# Patient Record
Sex: Male | Born: 1950 | Race: White | Hispanic: No | Marital: Single | Smoking: Current every day smoker
Health system: Southern US, Community
[De-identification: ages and names within clinical notes are randomized; demographics above are authoritative.]

## PROBLEM LIST (undated history)

## (undated) DIAGNOSIS — M1990 Unspecified osteoarthritis, unspecified site: Secondary | ICD-10-CM

## (undated) DIAGNOSIS — J449 Chronic obstructive pulmonary disease, unspecified: Secondary | ICD-10-CM

## (undated) DIAGNOSIS — F419 Anxiety disorder, unspecified: Secondary | ICD-10-CM

## (undated) DIAGNOSIS — J45909 Unspecified asthma, uncomplicated: Secondary | ICD-10-CM

## (undated) DIAGNOSIS — I10 Essential (primary) hypertension: Secondary | ICD-10-CM

## (undated) DIAGNOSIS — E785 Hyperlipidemia, unspecified: Secondary | ICD-10-CM

## (undated) HISTORY — PX: COLONOSCOPY: SHX174

---

## 2016-01-12 ENCOUNTER — Other Ambulatory Visit (HOSPITAL_COMMUNITY): Payer: Self-pay | Admitting: Nurse Practitioner

## 2016-01-12 DIAGNOSIS — F17200 Nicotine dependence, unspecified, uncomplicated: Secondary | ICD-10-CM

## 2016-01-26 ENCOUNTER — Ambulatory Visit (HOSPITAL_COMMUNITY)
Admission: RE | Admit: 2016-01-26 | Discharge: 2016-01-26 | Disposition: A | Discharge status: Home / Self Care | Payer: Medicare Other | Source: Ambulatory Visit | Attending: Nurse Practitioner | Admitting: Nurse Practitioner

## 2016-01-26 DIAGNOSIS — Z122 Encounter for screening for malignant neoplasm of respiratory organs: Secondary | ICD-10-CM | POA: Diagnosis not present

## 2016-01-26 DIAGNOSIS — F17200 Nicotine dependence, unspecified, uncomplicated: Secondary | ICD-10-CM | POA: Diagnosis present

## 2016-06-24 ENCOUNTER — Emergency Department (HOSPITAL_COMMUNITY)
Admission: EM | Admit: 2016-06-24 | Discharge: 2016-06-24 | Disposition: A | Discharge status: Home / Self Care | Payer: Medicare Other | Attending: Emergency Medicine | Admitting: Emergency Medicine

## 2016-06-24 ENCOUNTER — Encounter (HOSPITAL_COMMUNITY): Payer: Self-pay | Admitting: Emergency Medicine

## 2016-06-24 ENCOUNTER — Emergency Department (HOSPITAL_COMMUNITY): Payer: Medicare Other

## 2016-06-24 DIAGNOSIS — I10 Essential (primary) hypertension: Secondary | ICD-10-CM | POA: Insufficient documentation

## 2016-06-24 DIAGNOSIS — F17210 Nicotine dependence, cigarettes, uncomplicated: Secondary | ICD-10-CM | POA: Diagnosis not present

## 2016-06-24 DIAGNOSIS — J40 Bronchitis, not specified as acute or chronic: Secondary | ICD-10-CM | POA: Insufficient documentation

## 2016-06-24 DIAGNOSIS — R05 Cough: Secondary | ICD-10-CM | POA: Diagnosis present

## 2016-06-24 HISTORY — DX: Essential (primary) hypertension: I10

## 2016-06-24 MED ORDER — PREDNISONE 20 MG PO TABS: 40.0000 mg | ORAL_TABLET | Freq: Every day | ORAL | 0 refills | Status: DC

## 2016-06-24 MED ORDER — ALBUTEROL SULFATE HFA 108 (90 BASE) MCG/ACT IN AERS
2.0000 | INHALATION_SPRAY | Freq: Once | RESPIRATORY_TRACT | Status: AC
  Administered 2016-06-24: 2 via RESPIRATORY_TRACT
  Filled 2016-06-24: qty 6.7

## 2016-06-24 NOTE — ED Provider Notes (Signed)
[facility] DEPT Provider Note   CSN: [id number] Arrival date & time: 06/24/16  1534     History   Chief Complaint Chief Complaint  Patient presents with  . Cough    HPI Rodney Stone is a 66 y.o. male.  HPI   Rodney Stone is a 66 y.o. male who presents to the Emergency Department complaining of cough, nasal congestion and runny nose.  Symptoms present for 2 days.  Tried taking OTC Benadryl with some relief of the nasal congestion, but continues to have a productive cough and generalized malaise.  Nothing makes it better or worse.  He denies fever, body aches, chest pain or shortness of breath.   Past Medical History:  Diagnosis Date  . Hypertension     There are no active problems to display for this patient.   History reviewed. No pertinent surgical history.     Home Medications    Prior to Admission medications   Not on File    Family History History reviewed. No pertinent family history.  Social History Social History  Substance Use Topics  . Smoking status: Current Every Day Smoker    Packs/day: 0.50    Types: Cigarettes  . Smokeless tobacco: Not on file  . Alcohol use No     Allergies   Patient has no known allergies.   Review of Systems Review of Systems  Constitutional: Negative for activity change, appetite change, chills and fever.  HENT: Positive for congestion and rhinorrhea. Negative for facial swelling, sore throat and trouble swallowing.   Respiratory: Positive for cough. Negative for chest tightness, shortness of breath, wheezing and stridor.   Cardiovascular: Negative for chest pain.  Gastrointestinal: Negative for abdominal pain, nausea and vomiting.  Musculoskeletal: Negative for neck pain and neck stiffness.  Skin: Negative.  Negative for rash.  Neurological: Negative for dizziness, weakness, numbness and headaches.  Hematological: Negative for adenopathy.  All other systems reviewed and are negative.    Physical  Exam Updated Vital Signs BP 137/77   Pulse 74   Temp 98.7 F (37.1 C) (Oral)   Resp 18   Ht 6' (1.829 m)   Wt 58.1 kg   SpO2 98%   BMI 17.36 kg/m   Physical Exam  Constitutional: He is oriented to person, place, and time. He appears well-developed and well-nourished. No distress.  HENT:  Head: Normocephalic and atraumatic.  Right Ear: Tympanic membrane and ear canal normal.  Left Ear: Tympanic membrane and ear canal normal.  Nose: Mucosal edema and rhinorrhea present.  Mouth/Throat: Uvula is midline, oropharynx is clear and moist and mucous membranes are normal. No oropharyngeal exudate.  Eyes: EOM are normal. Pupils are equal, round, and reactive to light.  Neck: Normal range of motion, full passive range of motion without pain and phonation normal. Neck supple.  Cardiovascular: Normal rate, regular rhythm, normal heart sounds and intact distal pulses.   No murmur heard. Pulmonary/Chest: Effort normal. No stridor. No respiratory distress. He has no rales. He exhibits no tenderness.  Coarse lungs sounds bilaterally. No rales or wheezing  Musculoskeletal: Normal range of motion. He exhibits no edema.  Lymphadenopathy:    He has no cervical adenopathy.  Neurological: He is alert and oriented to person, place, and time. He exhibits normal muscle tone. Coordination normal.  Skin: Skin is warm. No rash noted.  Nursing note and vitals reviewed.    ED Treatments / Results  Labs (all labs ordered are listed, but only abnormal results are displayed) Labs  Reviewed - No data to display  EKG  EKG Interpretation None       Radiology Dg Chest 2 View  Result Date: 06/24/2016 CLINICAL DATA:  Allergies starting Monday, progression in congestion and productive cough today EXAM: CHEST  2 VIEW COMPARISON:  CT chest 01/26/2016 FINDINGS: Cardiomediastinal silhouette is unremarkable. Mild hyperinflation. No infiltrate or pulmonary edema. Stable bilateral chronic interstitial prominence  and mild emphysematous changes IMPRESSION: No active cardiopulmonary disease. Stable hyperinflation and chronic emphysematous changes. Electronically Signed   By: Lahoma Crocker M.D.   On: 06/24/2016 16:16    Procedures Procedures (including critical care time)  Medications Ordered in ED Medications - No data to display   Initial Impression / Assessment and Plan / ED Course  I have reviewed the triage vital signs and the nursing notes.  Pertinent labs & imaging results that were available during my care of the patient were reviewed by me and considered in my medical decision making (see chart for details).     Pt well appearing.  Vitals stable.  Doubt PE.  CXR neg for PNA.  Pt continues to smoke.  Likely bronchitis.  Albuterol MDI dispensed.  rx written for prednisone.  Return precautions given.    Final Clinical Impressions(s) / ED Diagnoses   Final diagnoses:  Bronchitis    New Prescriptions New Prescriptions   No medications on file     Kem Parkinson, Hershal Coria 06/24/16 1701    Milton Ferguson, MD 06/26/16 [phone]

## 2016-06-24 NOTE — ED Triage Notes (Signed)
Pt states having allergies on Monday, today has been more congested with productive cough.  Pt states "im just not feeling up to myself".  Pt denies pain at this time.

## 2016-06-24 NOTE — Discharge Instructions (Signed)
2 puffs of the inhaler 4 times a day as needed.  Follow-up with your doctor or return here for any worsening symptoms

## 2017-01-14 ENCOUNTER — Other Ambulatory Visit (HOSPITAL_COMMUNITY): Payer: Self-pay | Admitting: Nurse Practitioner

## 2017-01-14 DIAGNOSIS — Z720 Tobacco use: Secondary | ICD-10-CM

## 2017-01-14 DIAGNOSIS — Z122 Encounter for screening for malignant neoplasm of respiratory organs: Secondary | ICD-10-CM

## 2017-02-03 ENCOUNTER — Other Ambulatory Visit (HOSPITAL_COMMUNITY): Payer: Self-pay | Admitting: Nurse Practitioner

## 2017-02-03 DIAGNOSIS — Z720 Tobacco use: Secondary | ICD-10-CM

## 2017-02-03 DIAGNOSIS — Z87891 Personal history of nicotine dependence: Secondary | ICD-10-CM

## 2017-02-03 DIAGNOSIS — Z122 Encounter for screening for malignant neoplasm of respiratory organs: Secondary | ICD-10-CM

## 2017-02-07 ENCOUNTER — Encounter (HOSPITAL_COMMUNITY): Payer: Self-pay

## 2017-02-07 ENCOUNTER — Ambulatory Visit (HOSPITAL_COMMUNITY): Payer: Medicare HMO

## 2017-08-17 ENCOUNTER — Other Ambulatory Visit (HOSPITAL_COMMUNITY): Payer: Self-pay | Admitting: Nurse Practitioner

## 2017-08-17 DIAGNOSIS — Z720 Tobacco use: Secondary | ICD-10-CM

## 2017-08-23 ENCOUNTER — Other Ambulatory Visit (HOSPITAL_COMMUNITY): Payer: Self-pay | Admitting: Nurse Practitioner

## 2017-08-23 DIAGNOSIS — Z87891 Personal history of nicotine dependence: Secondary | ICD-10-CM

## 2017-08-23 DIAGNOSIS — Z122 Encounter for screening for malignant neoplasm of respiratory organs: Secondary | ICD-10-CM

## 2017-09-14 ENCOUNTER — Ambulatory Visit (HOSPITAL_COMMUNITY)
Admission: RE | Admit: 2017-09-14 | Discharge: 2017-09-14 | Disposition: A | Discharge status: Home / Self Care | Payer: Medicare HMO | Source: Ambulatory Visit | Attending: Nurse Practitioner | Admitting: Nurse Practitioner

## 2017-09-14 DIAGNOSIS — Z122 Encounter for screening for malignant neoplasm of respiratory organs: Secondary | ICD-10-CM | POA: Insufficient documentation

## 2017-09-14 DIAGNOSIS — Z87891 Personal history of nicotine dependence: Secondary | ICD-10-CM | POA: Diagnosis present

## 2017-09-14 DIAGNOSIS — J432 Centrilobular emphysema: Secondary | ICD-10-CM | POA: Insufficient documentation

## 2017-09-14 DIAGNOSIS — I700 Atherosclerosis of aorta: Secondary | ICD-10-CM | POA: Insufficient documentation

## 2018-06-10 ENCOUNTER — Emergency Department (HOSPITAL_COMMUNITY)
Admission: EM | Admit: 2018-06-10 | Discharge: 2018-06-10 | Disposition: A | Discharge status: Home / Self Care | Payer: Medicare HMO | Attending: Emergency Medicine | Admitting: Emergency Medicine

## 2018-06-10 ENCOUNTER — Encounter (HOSPITAL_COMMUNITY): Payer: Self-pay

## 2018-06-10 DIAGNOSIS — F17210 Nicotine dependence, cigarettes, uncomplicated: Secondary | ICD-10-CM | POA: Insufficient documentation

## 2018-06-10 DIAGNOSIS — J101 Influenza due to other identified influenza virus with other respiratory manifestations: Secondary | ICD-10-CM | POA: Insufficient documentation

## 2018-06-10 DIAGNOSIS — I10 Essential (primary) hypertension: Secondary | ICD-10-CM | POA: Diagnosis not present

## 2018-06-10 DIAGNOSIS — R6883 Chills (without fever): Secondary | ICD-10-CM | POA: Diagnosis present

## 2018-06-10 HISTORY — DX: Unspecified osteoarthritis, unspecified site: M19.90

## 2018-06-10 HISTORY — DX: Hyperlipidemia, unspecified: E78.5

## 2018-06-10 HISTORY — DX: Anxiety disorder, unspecified: F41.9

## 2018-06-10 LAB — INFLUENZA PANEL BY PCR (TYPE A & B)
Influenza A By PCR: POSITIVE — AB
Influenza B By PCR: NEGATIVE

## 2018-06-10 NOTE — Discharge Instructions (Addendum)
Evaluated today for influenza.  Your test was positive for flu A.  Please make sure to rest, rehydrate.  You may also use Tylenol and ibuprofen as needed for body aches and pains.  Follow up with PCP for reevaluation in the next 2 days.  Return to ED for any worsening symptoms.

## 2018-06-10 NOTE — ED Provider Notes (Signed)
[facility] EMERGENCY DEPARTMENT Provider Note   CSN: [id number] Arrival date & time: 06/10/18  1348  History   Chief Complaint Chief Complaint  Patient presents with  . flu like symptoms    HPI Rodney Stone is a 68 y.o. male with medical history significant for hypertension, hyperlipidemia who presents for evaluation of flulike symptoms.  Patient states symptoms started suddenly 4 days ago.  Patient states he has had chills, body aches and pains and no energy.  Patient's been tolerating p.o. intake without difficulty.  Patient states on Sunday he was "hanging out with my friend who was sick."  Unsure if this patient had the flu or not.  Has felt warm, however is not taken his temperature.  Has had congestion and rhinorrhea.  Rhinorrhea clear in color.  Did not get flu vaccine.  Denies headache, sore throat, cough, shortness of breath, chest pain, neck pain, neck stiffness, nausea, emesis, abdominal pain, diarrhea, dysuria, constipation.  Patient states "I just want to know if I have the flu and nothing else."  Patient states his symptoms have been improving since symptom onset.  He was able to go grocery shopping today as well as do "normal errands."  History provided by patient.  No interpreter was used.  HPI  Past Medical History:  Diagnosis Date  . Anxiety   . Arthritis   . Hyperlipidemia   . Hypertension     There are no active problems to display for this patient.   History reviewed. No pertinent surgical history.      Home Medications    Prior to Admission medications   Medication Sig Start Date End Date Taking? Authorizing Provider  predniSONE (DELTASONE) 20 MG tablet Take 2 tablets (40 mg total) by mouth daily. For 5 days 06/24/16   Kem Parkinson, PA-C    Family History No family history on file.  Social History Social History   Tobacco Use  . Smoking status: Current Every Day Smoker    Packs/day: 0.50    Types: Cigarettes  Substance Use Topics  .  Alcohol use: No  . Drug use: No     Allergies   Patient has no known allergies.   Review of Systems Review of Systems  Constitutional: Positive for activity change, chills and fever.  HENT: Positive for congestion, postnasal drip and rhinorrhea. Negative for sinus pressure, sinus pain, sneezing, sore throat, tinnitus, trouble swallowing and voice change.   Eyes: Negative.   Respiratory: Negative.   Cardiovascular: Negative.   Gastrointestinal: Negative.   Genitourinary: Negative.   Musculoskeletal: Negative.   Skin: Negative.   Neurological: Negative.   All other systems reviewed and are negative.    Physical Exam Updated Vital Signs BP 117/98 (BP Location: Right Arm)   Pulse (!) 105   Temp 98.7 F (37.1 C) (Oral)   Resp 18   Ht 6' (1.829 m)   Wt 59.9 kg   SpO2 97%   BMI 17.90 kg/m   Physical Exam Vitals signs and nursing note reviewed.  Constitutional:      General: He is not in acute distress.    Appearance: He is well-developed. He is not ill-appearing, toxic-appearing or diaphoretic.  HENT:     Head: Normocephalic and atraumatic.     Jaw: There is normal jaw occlusion.     Right Ear: Tympanic membrane, ear canal and external ear normal. No drainage, swelling or tenderness. Tympanic membrane is not perforated, erythematous, retracted or bulging.     Left Ear:  Tympanic membrane, ear canal and external ear normal. No drainage, swelling or tenderness. Tympanic membrane is not perforated, erythematous, retracted or bulging.     Nose: Rhinorrhea present. No nasal tenderness or mucosal edema.     Right Sinus: No maxillary sinus tenderness or frontal sinus tenderness.     Left Sinus: No maxillary sinus tenderness or frontal sinus tenderness.     Comments: Clear rhinorrhea to bilateral nares.    Mouth/Throat:     Lips: Pink.     Mouth: Mucous membranes are moist.     Pharynx: Oropharynx is clear. Uvula midline.     Tonsils: No tonsillar exudate or tonsillar  abscesses.     Comments: Posterior oropharynx clear.  Uvula midline without deviation.  No tonsillar edema or exudate.  Mucous membranes moist. Eyes:     Pupils: Pupils are equal, round, and reactive to light.  Neck:     Musculoskeletal: Full passive range of motion without pain, normal range of motion and neck supple.     Comments: No neck stiffness or neck rigidity.  Phonation normal.  No drooling, trismus or dysphasia. Cardiovascular:     Rate and Rhythm: Regular rhythm. Tachycardia present.     Pulses: Normal pulses.     Heart sounds: Normal heart sounds.     Comments: HR 100 Pulmonary:     Effort: Pulmonary effort is normal. No respiratory distress.     Comments: Clear to auscultation bilaterally without wheeze, rhonchi or rales.  No accessory muscle usage.  Able speak in full sentences without difficulty. Abdominal:     General: There is no distension.     Palpations: Abdomen is soft.     Comments: Soft, nontender without rebound or guarding.  Normoactive bowel sounds.  Musculoskeletal: Normal range of motion.     Comments: Moves all extremities without difficulty.  Ambulatory in department.  Lymphadenopathy:     Comments: No cervical lymphadenopathy.  Skin:    General: Skin is warm and dry.     Comments: No rashes.  Neurological:     Mental Status: He is alert.      ED Treatments / Results  Labs (all labs ordered are listed, but only abnormal results are displayed) Labs Reviewed  INFLUENZA PANEL BY PCR (TYPE A & B) - Abnormal; Notable for the following components:      Result Value   Influenza A By PCR POSITIVE (*)    All other components within normal limits    EKG None  Radiology No results found.  Procedures Procedures (including critical care time)  Medications Ordered in ED Medications - No data to display   Initial Impression / Assessment and Plan / ED Course  I have reviewed the triage vital signs and the nursing notes.  Pertinent labs & imaging  results that were available during my care of the patient were reviewed by me and considered in my medical decision making (see chart for details).  68 year old male who appears otherwise well presents for evaluation of flulike symptoms.  Afebrile, nonseptic, non-ill-appearing.  Symptom onset 4 days ago, Wednesday.  Patient with multiple sick exposures.  Did not receive flu vaccine.  Patient does not want imaging or blood work at this time.  Discussed risk vs benefit.  Patient voiced understanding and has declined additional testing at this time.  Tachycardia at 100 during exam.  Oxygen saturation 97% on room air.  Lungs clear to auscultation bilaterally without wheeze, rhonchi or rales.  No accessory muscle usage.  No hypoxia or tachypnea, low suspicion for pneumonia.  Posterior oropharynx clear, tonsils without edema or exudate.  Mucous membranes moist.  Low suspicion for pharyngitis.  Patient positive for influenza A.  He appears otherwise well on exam.  Ambulatory in department without difficulty. Able to tolerate p.o. intake without difficulty.   Patient with symptoms consistent with influenza.  Vitals are stable.  No signs of dehydration. The patient understands that symptoms are greater than the recommended 24-48 hour window of treatment.  Patient will be discharged with instructions to orally hydrate, rest, and use over-the-counter medications such as anti-inflammatories ibuprofen and Aleve for muscle aches and Tylenol for fever. Patient  hemodynamically stable and appropriate for DC home at this time. Nursing is notified me that patient's blood pressure at DC 97/68.  Patient refusing recheck at this time.  Discussed with him follow-up with PCP for reevaluation.  He has no dizziness or lightheadedness.  Discussed strict return precautions.  Patient voiced understanding and is agreeable for follow-up.    Final Clinical Impressions(s) / ED Diagnoses   Final diagnoses:  Influenza A    ED Discharge  Orders    None       Henderly, Britni A, PA-C 06/10/18 1852    Julianne Rice, MD 06/11/18 1536

## 2018-06-10 NOTE — ED Triage Notes (Addendum)
Pt reports that he body aches, fever, chills and no energy for several days

## 2019-03-23 ENCOUNTER — Other Ambulatory Visit: Payer: Self-pay | Admitting: Nurse Practitioner

## 2019-03-23 DIAGNOSIS — Z720 Tobacco use: Secondary | ICD-10-CM

## 2019-04-11 ENCOUNTER — Ambulatory Visit (HOSPITAL_COMMUNITY): Payer: Medicare HMO

## 2019-04-11 ENCOUNTER — Encounter (HOSPITAL_COMMUNITY): Payer: Self-pay

## 2019-05-25 ENCOUNTER — Other Ambulatory Visit: Payer: Self-pay

## 2019-05-25 ENCOUNTER — Ambulatory Visit (HOSPITAL_COMMUNITY)
Admission: RE | Admit: 2019-05-25 | Discharge: 2019-05-25 | Disposition: A | Discharge status: Home / Self Care | Payer: Medicare HMO | Source: Ambulatory Visit | Attending: Nurse Practitioner | Admitting: Nurse Practitioner

## 2019-05-25 DIAGNOSIS — Z720 Tobacco use: Secondary | ICD-10-CM | POA: Insufficient documentation

## 2019-10-04 ENCOUNTER — Encounter: Payer: Self-pay | Admitting: Internal Medicine

## 2019-12-25 ENCOUNTER — Ambulatory Visit: Payer: Medicare HMO | Admitting: Nurse Practitioner

## 2019-12-25 ENCOUNTER — Encounter: Payer: Self-pay | Admitting: Nurse Practitioner

## 2019-12-25 NOTE — Progress Notes (Deleted)
Primary Care Physician:  Renee Rival, NP Primary Gastroenterologist:  Dr.   Rayne Du chief complaint on file.   HPI:   Rodney Stone is a 69 y.o. male who presents on referral from primary care to schedule colonoscopy.  Nurse/phone triage was deferred office visit due to medications likely necessitating MAC sedation.  No history of colonoscopy in our system.  Today he states he is doing okay overall   Past Medical History:  Diagnosis Date  . Anxiety   . Arthritis   . Hyperlipidemia   . Hypertension     No past surgical history on file.  Current Outpatient Medications  Medication Sig Dispense Refill  . predniSONE (DELTASONE) 20 MG tablet Take 2 tablets (40 mg total) by mouth daily. For 5 days 10 tablet 0   No current facility-administered medications for this visit.    Allergies as of 12/25/2019  . (No Known Allergies)    No family history on file.  Social History   Socioeconomic History  . Marital status: Widowed    Spouse name: Not on file  . Number of children: Not on file  . Years of education: Not on file  . Highest education level: Not on file  Occupational History  . Not on file  Tobacco Use  . Smoking status: Current Every Day Smoker    Packs/day: 0.50    Types: Cigarettes  Substance and Sexual Activity  . Alcohol use: No  . Drug use: No  . Sexual activity: Not on file  Other Topics Concern  . Not on file  Social History Narrative  . Not on file   Social Determinants of Health   Financial Resource Strain:   . Difficulty of Paying Living Expenses: Not on file  Food Insecurity:   . Worried About Charity fundraiser in the Last Year: Not on file  . Ran Out of Food in the Last Year: Not on file  Transportation Needs:   . Lack of Transportation (Medical): Not on file  . Lack of Transportation (Non-Medical): Not on file  Physical Activity:   . Days of Exercise per Week: Not on file  . Minutes of Exercise per Session: Not on file    Stress:   . Feeling of Stress : Not on file  Social Connections:   . Frequency of Communication with Friends and Family: Not on file  . Frequency of Social Gatherings with Friends and Family: Not on file  . Attends Religious Services: Not on file  . Active Member of Clubs or Organizations: Not on file  . Attends Archivist Meetings: Not on file  . Marital Status: Not on file  Intimate Partner Violence:   . Fear of Current or Ex-Partner: Not on file  . Emotionally Abused: Not on file  . Physically Abused: Not on file  . Sexually Abused: Not on file    Subjective: Review of Systems  Constitutional: Negative for chills, fever, malaise/fatigue and weight loss.  HENT: Negative for congestion and sore throat.   Respiratory: Negative for cough and shortness of breath.   Cardiovascular: Negative for chest pain and palpitations.  Gastrointestinal: Negative for abdominal pain, blood in stool, diarrhea, melena, nausea and vomiting.  Musculoskeletal: Negative for joint pain and myalgias.  Skin: Negative for rash.  Neurological: Negative for dizziness and weakness.  Endo/Heme/Allergies: Does not bruise/bleed easily.  Psychiatric/Behavioral: Negative for depression. The patient is not nervous/anxious.   All other systems reviewed and are negative.  Objective: There were no vitals taken for this visit. Physical Exam Vitals and nursing note reviewed.  Constitutional:      General: He is not in acute distress.    Appearance: Normal appearance. He is not ill-appearing, toxic-appearing or diaphoretic.  HENT:     Head: Normocephalic and atraumatic.     Nose: No congestion or rhinorrhea.  Eyes:     General: No scleral icterus. Cardiovascular:     Rate and Rhythm: Normal rate and regular rhythm.     Heart sounds: Normal heart sounds.  Pulmonary:     Effort: Pulmonary effort is normal.     Breath sounds: Normal breath sounds.  Abdominal:     General: Bowel sounds are  normal. There is no distension.     Palpations: Abdomen is soft. There is no hepatomegaly, splenomegaly or mass.     Tenderness: There is no abdominal tenderness. There is no guarding or rebound.     Hernia: No hernia is present.  Musculoskeletal:     Cervical back: Neck supple.  Skin:    General: Skin is warm and dry.     Coloration: Skin is not jaundiced.     Findings: No bruising or rash.  Neurological:     General: No focal deficit present.     Mental Status: He is alert and oriented to person, place, and time. Mental status is at baseline.  Psychiatric:        Mood and Affect: Mood normal.        Behavior: Behavior normal.        Thought Content: Thought content normal.      Assessment:  ***   Plan: ***      12/25/2019 7:50 AM   Disclaimer: This note was dictated with voice recognition software. Similar sounding words can inadvertently be transcribed and may not be corrected upon review.

## 2020-01-01 ENCOUNTER — Encounter: Payer: Self-pay | Admitting: Internal Medicine

## 2020-02-19 ENCOUNTER — Ambulatory Visit: Payer: Medicare HMO | Admitting: Nurse Practitioner

## 2020-02-20 ENCOUNTER — Ambulatory Visit: Payer: Medicare HMO | Admitting: Internal Medicine

## 2020-02-20 ENCOUNTER — Encounter: Payer: Self-pay | Admitting: Internal Medicine

## 2020-02-20 ENCOUNTER — Telehealth: Payer: Self-pay | Admitting: *Deleted

## 2020-02-20 ENCOUNTER — Other Ambulatory Visit: Payer: Self-pay

## 2020-02-20 DIAGNOSIS — Z86010 Personal history of colonic polyps: Secondary | ICD-10-CM

## 2020-02-20 DIAGNOSIS — K219 Gastro-esophageal reflux disease without esophagitis: Secondary | ICD-10-CM | POA: Diagnosis not present

## 2020-02-20 DIAGNOSIS — Z800 Family history of malignant neoplasm of digestive organs: Secondary | ICD-10-CM | POA: Diagnosis not present

## 2020-02-20 MED ORDER — NA SULFATE-K SULFATE-MG SULF 17.5-3.13-1.6 GM/177ML PO SOLN: 1.0000 | Freq: Once | ORAL | 0 refills | Status: AC

## 2020-02-20 NOTE — Patient Instructions (Addendum)
We will schedule you for colonoscopy today in clinic given history of colon polyps as well as family history of colorectal cancer.  I would recommend that we perform colonoscopy at least every 5 years going forward.  Further recommendations to follow.  Lifestyle and home remedies TO MANAGE REFLUX/HEARTBURN    You may eliminate or reduce the frequency of heartburn by making the following lifestyle changes:    Control your weight. Being overweight is a major risk factor for heartburn and GERD. Excess pounds put pressure on your abdomen, pushing up your stomach and causing acid to back up into your esophagus.     Eat smaller meals. 4 TO 6 MEALS A DAY. This reduces pressure on the lower esophageal sphincter, helping to prevent the valve from opening and acid from washing back into your esophagus.      Loosen your belt. Clothes that fit tightly around your waist put pressure on your abdomen and the lower esophageal sphincter.      Eliminate heartburn triggers. Everyone has specific triggers. Common triggers such as fatty or fried foods, spicy food, tomato sauce, carbonated beverages, alcohol, chocolate, mint, garlic, onion, caffeine and nicotine may make heartburn worse.     Avoid stooping or bending. Tying your shoes is OK. Bending over for longer periods to weed your garden isn't, especially soon after eating.     Don't lie down after a meal. Wait at least three to four hours after eating before going to bed, and don't lie down right after eating.    At [facility] we value your feedback. You may receive a survey about your visit today. Please share your experience as we strive to create trusting relationships with our patients to provide genuine, compassionate, quality care.  We appreciate your understanding and patience as we review any laboratory studies, imaging, and other diagnostic tests that are ordered as we care for you. Our office policy is 5 business days for  review of these results, and any emergent or urgent results are addressed in a timely manner for your best interest. If you do not hear from our office in 1 week, please contact us.   We also encourage the use of MyChart, which contains your medical information for your review as well. If you are not enrolled in this feature, an access code is on this after visit summary for your convenience. Thank you for allowing [place] to be involved in your care.  It was great to see you today!  I hope you have a great rest of your fall!!   Neveah Bang K. Abbey Chatters, D.O. Gastroenterology and Hepatology [facility]

## 2020-02-20 NOTE — H&P (View-Only) (Signed)
Primary Care Physician:  Renee Rival, NP Primary Gastroenterologist:  Dr. Abbey Chatters  Chief Complaint  Patient presents with  . Consult    TCS done age 69 in [place], [place]. Mother passed away age 33 with colon cancer    HPI:   Rodney Stone is a 69 y.o. male who presents to clinic today by referral from his PCP Angelina Ok to discuss colonoscopy.  States his last colonoscopy was 7 years ago performed in [place].  Notes multiple polyps removed though I do not have access to this report.  Also notes that his mother died from colon cancer at the age of 64.  Patient denies any melena hematochezia.  No unintentional weight loss.  No abdominal pain.  Does have intermittent reflux which appears to be related to what he eats.  Does not take any chronic medications for reflux but will take as needed Tums which relieves his symptoms.  No overt dysphagia or odynophagia.  No history of PUD or H. pylori that he knows of.  Otherwise he has no other complaints.  Past Medical History:  Diagnosis Date  . Anxiety   . Arthritis   . Hyperlipidemia   . Hypertension     Past Surgical History:  Procedure Laterality Date  . COLONOSCOPY      Current Outpatient Medications  Medication Sig Dispense Refill  . albuterol (VENTOLIN HFA) 108 (90 Base) MCG/ACT inhaler Inhale into the lungs as needed.    Marland Kitchen buPROPion (WELLBUTRIN XL) 300 MG 24 hr tablet Take 300 mg by mouth every morning.    . Cholecalciferol (VITAMIN D3 PO) Take by mouth daily.    . clonazePAM (KLONOPIN) 0.5 MG tablet Take 0.5 mg by mouth 3 (three) times daily as needed.    Marland Kitchen lisinopril (ZESTRIL) 20 MG tablet Take 20 mg by mouth daily.    . naproxen sodium (ALEVE) 220 MG tablet Take 220 mg by mouth daily as needed.    Marland Kitchen oxyCODONE-acetaminophen (PERCOCET) 7.5-325 MG tablet Take 1 tablet by mouth 4 (four) times daily as needed.    . simvastatin (ZOCOR) 20 MG tablet Take 20 mg by mouth at bedtime.     No current  facility-administered medications for this visit.    Allergies as of 02/20/2020  . (No Known Allergies)    Family History  Problem Relation Age of Onset  . Colon cancer Mother   . Stroke Father     Social History   Socioeconomic History  . Marital status: Widowed    Spouse name: Not on file  . Number of children: Not on file  . Years of education: Not on file  . Highest education level: Not on file  Occupational History  . Not on file  Tobacco Use  . Smoking status: Current Every Day Smoker    Packs/day: 0.50    Types: Cigarettes  . Smokeless tobacco: Never Used  Substance and Sexual Activity  . Alcohol use: No  . Drug use: No  . Sexual activity: Not on file  Other Topics Concern  . Not on file  Social History Narrative  . Not on file   Social Determinants of Health   Financial Resource Strain:   . Difficulty of Paying Living Expenses: Not on file  Food Insecurity:   . Worried About Charity fundraiser in the Last Year: Not on file  . Ran Out of Food in the Last Year: Not on file  Transportation Needs:   . Lack of  Transportation (Medical): Not on file  . Lack of Transportation (Non-Medical): Not on file  Physical Activity:   . Days of Exercise per Week: Not on file  . Minutes of Exercise per Session: Not on file  Stress:   . Feeling of Stress : Not on file  Social Connections:   . Frequency of Communication with Friends and Family: Not on file  . Frequency of Social Gatherings with Friends and Family: Not on file  . Attends Religious Services: Not on file  . Active Member of Clubs or Organizations: Not on file  . Attends Archivist Meetings: Not on file  . Marital Status: Not on file  Intimate Partner Violence:   . Fear of Current or Ex-Partner: Not on file  . Emotionally Abused: Not on file  . Physically Abused: Not on file  . Sexually Abused: Not on file    Subjective: Review of Systems  Constitutional: Negative for chills and fever.    HENT: Negative for congestion and hearing loss.   Eyes: Negative for blurred vision and double vision.  Respiratory: Negative for cough and shortness of breath.   Cardiovascular: Negative for chest pain and palpitations.  Gastrointestinal: Negative for abdominal pain, blood in stool, constipation, diarrhea, heartburn, melena and vomiting.  Genitourinary: Negative for dysuria and urgency.  Musculoskeletal: Negative for joint pain and myalgias.  Skin: Negative for itching and rash.  Neurological: Negative for dizziness and headaches.  Psychiatric/Behavioral: Negative for depression. The patient is not nervous/anxious.        Objective: BP 125/82   Pulse (!) 121   Temp (!) 97.1 F (36.2 C)   Ht 6' (1.829 m)   Wt 150 lb 12.8 oz (68.4 kg)   BMI 20.45 kg/m  Physical Exam Constitutional:      Appearance: Normal appearance.  HENT:     Head: Normocephalic and atraumatic.  Eyes:     Extraocular Movements: Extraocular movements intact.     Conjunctiva/sclera: Conjunctivae normal.  Cardiovascular:     Rate and Rhythm: Normal rate and regular rhythm.  Pulmonary:     Effort: Pulmonary effort is normal.     Breath sounds: Normal breath sounds.  Abdominal:     General: Bowel sounds are normal.     Palpations: Abdomen is soft.  Musculoskeletal:        General: Normal range of motion.     Cervical back: Normal range of motion and neck supple.  Skin:    General: Skin is warm.  Neurological:     General: No focal deficit present.     Mental Status: He is alert and oriented to person, place, and time.  Psychiatric:        Mood and Affect: Mood normal.        Behavior: Behavior normal.      Assessment: *History of adenomatous colon polyps *Family history of colorectal cancer in mother *GERD-intermittent, mild  Plan: We will schedule patient for surveillance colonoscopy today in clinic The risks including infection, bleed, or perforation as well as benefits, limitations,  alternatives and imponderables have been reviewed with the patient. Questions have been answered. All parties agreeable.  I have recommended that we perform colonoscopy at least every 5 years given his family history of colon cancer and he understands.  His chronic reflux is intermittent and mild well-controlled on as needed Tums.  If this worsens, we can consider starting him on PPI therapy and/or further evaluation with EGD.  I will print off home  practices to reduce reflux symptoms.  Thank you Angelina Ok for the kind referral.  02/20/2020 9:25 AM   Disclaimer: This note was dictated with voice recognition software. Similar sounding words can inadvertently be transcribed and may not be corrected upon review.

## 2020-02-20 NOTE — Telephone Encounter (Signed)
error 

## 2020-02-20 NOTE — Progress Notes (Signed)
Primary Care Physician:  Renee Rival, NP Primary Gastroenterologist:  Dr. Abbey Chatters  Chief Complaint  Patient presents with  . Consult    TCS done age 69 in [place], [place]. Mother passed away age 29 with colon cancer    HPI:   Rodney Stone is a 69 y.o. male who presents to clinic today by referral from his PCP Angelina Ok to discuss colonoscopy.  States his last colonoscopy was 7 years ago performed in [place].  Notes multiple polyps removed though I do not have access to this report.  Also notes that his mother died from colon cancer at the age of 79.  Patient denies any melena hematochezia.  No unintentional weight loss.  No abdominal pain.  Does have intermittent reflux which appears to be related to what he eats.  Does not take any chronic medications for reflux but will take as needed Tums which relieves his symptoms.  No overt dysphagia or odynophagia.  No history of PUD or H. pylori that he knows of.  Otherwise he has no other complaints.  Past Medical History:  Diagnosis Date  . Anxiety   . Arthritis   . Hyperlipidemia   . Hypertension     Past Surgical History:  Procedure Laterality Date  . COLONOSCOPY      Current Outpatient Medications  Medication Sig Dispense Refill  . albuterol (VENTOLIN HFA) 108 (90 Base) MCG/ACT inhaler Inhale into the lungs as needed.    Marland Kitchen buPROPion (WELLBUTRIN XL) 300 MG 24 hr tablet Take 300 mg by mouth every morning.    . Cholecalciferol (VITAMIN D3 PO) Take by mouth daily.    . clonazePAM (KLONOPIN) 0.5 MG tablet Take 0.5 mg by mouth 3 (three) times daily as needed.    Marland Kitchen lisinopril (ZESTRIL) 20 MG tablet Take 20 mg by mouth daily.    . naproxen sodium (ALEVE) 220 MG tablet Take 220 mg by mouth daily as needed.    Marland Kitchen oxyCODONE-acetaminophen (PERCOCET) 7.5-325 MG tablet Take 1 tablet by mouth 4 (four) times daily as needed.    . simvastatin (ZOCOR) 20 MG tablet Take 20 mg by mouth at bedtime.     No current  facility-administered medications for this visit.    Allergies as of 02/20/2020  . (No Known Allergies)    Family History  Problem Relation Age of Onset  . Colon cancer Mother   . Stroke Father     Social History   Socioeconomic History  . Marital status: Widowed    Spouse name: Not on file  . Number of children: Not on file  . Years of education: Not on file  . Highest education level: Not on file  Occupational History  . Not on file  Tobacco Use  . Smoking status: Current Every Day Smoker    Packs/day: 0.50    Types: Cigarettes  . Smokeless tobacco: Never Used  Substance and Sexual Activity  . Alcohol use: No  . Drug use: No  . Sexual activity: Not on file  Other Topics Concern  . Not on file  Social History Narrative  . Not on file   Social Determinants of Health   Financial Resource Strain:   . Difficulty of Paying Living Expenses: Not on file  Food Insecurity:   . Worried About Charity fundraiser in the Last Year: Not on file  . Ran Out of Food in the Last Year: Not on file  Transportation Needs:   . Lack of  Transportation (Medical): Not on file  . Lack of Transportation (Non-Medical): Not on file  Physical Activity:   . Days of Exercise per Week: Not on file  . Minutes of Exercise per Session: Not on file  Stress:   . Feeling of Stress : Not on file  Social Connections:   . Frequency of Communication with Friends and Family: Not on file  . Frequency of Social Gatherings with Friends and Family: Not on file  . Attends Religious Services: Not on file  . Active Member of Clubs or Organizations: Not on file  . Attends Archivist Meetings: Not on file  . Marital Status: Not on file  Intimate Partner Violence:   . Fear of Current or Ex-Partner: Not on file  . Emotionally Abused: Not on file  . Physically Abused: Not on file  . Sexually Abused: Not on file    Subjective: Review of Systems  Constitutional: Negative for chills and fever.   HENT: Negative for congestion and hearing loss.   Eyes: Negative for blurred vision and double vision.  Respiratory: Negative for cough and shortness of breath.   Cardiovascular: Negative for chest pain and palpitations.  Gastrointestinal: Negative for abdominal pain, blood in stool, constipation, diarrhea, heartburn, melena and vomiting.  Genitourinary: Negative for dysuria and urgency.  Musculoskeletal: Negative for joint pain and myalgias.  Skin: Negative for itching and rash.  Neurological: Negative for dizziness and headaches.  Psychiatric/Behavioral: Negative for depression. The patient is not nervous/anxious.        Objective: BP 125/82   Pulse (!) 121   Temp (!) 97.1 F (36.2 C)   Ht 6' (1.829 m)   Wt 150 lb 12.8 oz (68.4 kg)   BMI 20.45 kg/m  Physical Exam Constitutional:      Appearance: Normal appearance.  HENT:     Head: Normocephalic and atraumatic.  Eyes:     Extraocular Movements: Extraocular movements intact.     Conjunctiva/sclera: Conjunctivae normal.  Cardiovascular:     Rate and Rhythm: Normal rate and regular rhythm.  Pulmonary:     Effort: Pulmonary effort is normal.     Breath sounds: Normal breath sounds.  Abdominal:     General: Bowel sounds are normal.     Palpations: Abdomen is soft.  Musculoskeletal:        General: Normal range of motion.     Cervical back: Normal range of motion and neck supple.  Skin:    General: Skin is warm.  Neurological:     General: No focal deficit present.     Mental Status: He is alert and oriented to person, place, and time.  Psychiatric:        Mood and Affect: Mood normal.        Behavior: Behavior normal.      Assessment: *History of adenomatous colon polyps *Family history of colorectal cancer in mother *GERD-intermittent, mild  Plan: We will schedule patient for surveillance colonoscopy today in clinic The risks including infection, bleed, or perforation as well as benefits, limitations,  alternatives and imponderables have been reviewed with the patient. Questions have been answered. All parties agreeable.  I have recommended that we perform colonoscopy at least every 5 years given his family history of colon cancer and he understands.  His chronic reflux is intermittent and mild well-controlled on as needed Tums.  If this worsens, we can consider starting him on PPI therapy and/or further evaluation with EGD.  I will print off home practices  to reduce reflux symptoms.  Thank you Angelina Ok for the kind referral.  02/20/2020 9:25 AM   Disclaimer: This note was dictated with voice recognition software. Similar sounding words can inadvertently be transcribed and may not be corrected upon review.

## 2020-03-11 ENCOUNTER — Encounter (HOSPITAL_COMMUNITY): Payer: Self-pay | Admitting: *Deleted

## 2020-03-13 ENCOUNTER — Other Ambulatory Visit (HOSPITAL_COMMUNITY)
Admission: RE | Admit: 2020-03-13 | Discharge: 2020-03-13 | Disposition: A | Discharge status: Home / Self Care | Payer: Medicare HMO | Source: Ambulatory Visit | Attending: Internal Medicine | Admitting: Internal Medicine

## 2020-03-13 ENCOUNTER — Telehealth: Payer: Self-pay | Admitting: Internal Medicine

## 2020-03-13 ENCOUNTER — Other Ambulatory Visit: Payer: Self-pay

## 2020-03-13 DIAGNOSIS — Z01812 Encounter for preprocedural laboratory examination: Secondary | ICD-10-CM | POA: Diagnosis present

## 2020-03-13 DIAGNOSIS — Z20822 Contact with and (suspected) exposure to covid-19: Secondary | ICD-10-CM | POA: Diagnosis not present

## 2020-03-13 LAB — SARS CORONAVIRUS 2 (TAT 6-24 HRS): SARS Coronavirus 2: NEGATIVE

## 2020-03-13 NOTE — Telephone Encounter (Signed)
Pt called to say that he had started his clear liquid diet yesterday and realized he didn't get banana popsicle and he had eaten a banana cream instead. Is this ok? Please advise. [phone]

## 2020-03-13 NOTE — Telephone Encounter (Signed)
Called pt. He states he only ate 1. I advised to make sure he drinks lots of clear liquids during prep. Advised NO MORE banana cream. He voiced understanding.

## 2020-03-14 ENCOUNTER — Encounter (HOSPITAL_COMMUNITY): Payer: Self-pay

## 2020-03-14 ENCOUNTER — Encounter (HOSPITAL_COMMUNITY)
Admission: RE | Disposition: A | Discharge status: Home / Self Care | Payer: Self-pay | Source: Home / Self Care | Attending: Internal Medicine

## 2020-03-14 ENCOUNTER — Ambulatory Visit (HOSPITAL_COMMUNITY): Payer: Medicare HMO | Admitting: Anesthesiology

## 2020-03-14 ENCOUNTER — Ambulatory Visit (HOSPITAL_COMMUNITY)
Admission: RE | Admit: 2020-03-14 | Discharge: 2020-03-14 | Disposition: A | Discharge status: Home / Self Care | Payer: Medicare HMO | Attending: Internal Medicine | Admitting: Internal Medicine

## 2020-03-14 ENCOUNTER — Other Ambulatory Visit: Payer: Self-pay

## 2020-03-14 DIAGNOSIS — Z1211 Encounter for screening for malignant neoplasm of colon: Secondary | ICD-10-CM | POA: Insufficient documentation

## 2020-03-14 DIAGNOSIS — K5730 Diverticulosis of large intestine without perforation or abscess without bleeding: Secondary | ICD-10-CM | POA: Insufficient documentation

## 2020-03-14 DIAGNOSIS — E785 Hyperlipidemia, unspecified: Secondary | ICD-10-CM | POA: Insufficient documentation

## 2020-03-14 DIAGNOSIS — K648 Other hemorrhoids: Secondary | ICD-10-CM | POA: Insufficient documentation

## 2020-03-14 DIAGNOSIS — K219 Gastro-esophageal reflux disease without esophagitis: Secondary | ICD-10-CM | POA: Insufficient documentation

## 2020-03-14 DIAGNOSIS — Z86010 Personal history of colonic polyps: Secondary | ICD-10-CM | POA: Insufficient documentation

## 2020-03-14 DIAGNOSIS — I10 Essential (primary) hypertension: Secondary | ICD-10-CM | POA: Insufficient documentation

## 2020-03-14 DIAGNOSIS — F17210 Nicotine dependence, cigarettes, uncomplicated: Secondary | ICD-10-CM | POA: Insufficient documentation

## 2020-03-14 DIAGNOSIS — Z79899 Other long term (current) drug therapy: Secondary | ICD-10-CM | POA: Insufficient documentation

## 2020-03-14 DIAGNOSIS — Z823 Family history of stroke: Secondary | ICD-10-CM | POA: Insufficient documentation

## 2020-03-14 DIAGNOSIS — K635 Polyp of colon: Secondary | ICD-10-CM | POA: Insufficient documentation

## 2020-03-14 DIAGNOSIS — Z800 Family history of malignant neoplasm of digestive organs: Secondary | ICD-10-CM | POA: Diagnosis not present

## 2020-03-14 HISTORY — PX: COLONOSCOPY WITH PROPOFOL: SHX5780

## 2020-03-14 HISTORY — PX: POLYPECTOMY: SHX5525

## 2020-03-14 SURGERY — COLONOSCOPY WITH PROPOFOL
Anesthesia: General

## 2020-03-14 MED ORDER — CHLORHEXIDINE GLUCONATE CLOTH 2 % EX PADS: 6.0000 | MEDICATED_PAD | Freq: Once | CUTANEOUS | Status: DC

## 2020-03-14 MED ORDER — STERILE WATER FOR IRRIGATION IR SOLN
Status: DC | PRN
  Administered 2020-03-14: 100 mL

## 2020-03-14 MED ORDER — LIDOCAINE HCL (CARDIAC) PF 100 MG/5ML IV SOSY
PREFILLED_SYRINGE | INTRAVENOUS | Status: DC | PRN
  Administered 2020-03-14: 50 mg via INTRAVENOUS

## 2020-03-14 MED ORDER — LACTATED RINGERS IV SOLN: INTRAVENOUS | Status: DC | PRN

## 2020-03-14 MED ORDER — PROPOFOL 500 MG/50ML IV EMUL
INTRAVENOUS | Status: DC | PRN
  Administered 2020-03-14: 100 ug/kg/min via INTRAVENOUS

## 2020-03-14 MED ORDER — LACTATED RINGERS IV SOLN: Freq: Once | INTRAVENOUS | Status: AC

## 2020-03-14 MED ORDER — PROPOFOL 10 MG/ML IV BOLUS
INTRAVENOUS | Status: DC | PRN
  Administered 2020-03-14: 70 mg via INTRAVENOUS

## 2020-03-14 NOTE — Op Note (Signed)
[facility] Patient Name: Rodney Stone Procedure Date: 03/14/2020 10:11 AM MRN: [id number] Date of Birth: 1950-10-31 Attending MD: Elon Alas. Abbey Chatters DO CSN: [id number] Age: 69 Admit Type: Outpatient Procedure:                Colonoscopy Indications:              High risk colon cancer surveillance: Personal                            history of colonic polyps, Family history of colon                            cancer in a first-degree relative before age 44                            years Providers:                Elon Alas. Abbey Chatters, DO, Lambert Mody, Aram Candela Referring MD:              Medicines:                See the Anesthesia note for documentation of the                            administered medications Complications:            No immediate complications. Estimated Blood Loss:     Estimated blood loss was minimal. Procedure:                Pre-Anesthesia Assessment:                           - The anesthesia plan was to use monitored                            anesthesia care (MAC).                           After obtaining informed consent, the colonoscope                            was passed under direct vision. Throughout the                            procedure, the patient's blood pressure, pulse, and                            oxygen saturations were monitored continuously. The                            [id number]-[id number] ([id number]) scope was introduced through                            the anus and advanced to the the cecum, identified  by appendiceal orifice and ileocecal valve. The                            colonoscopy was performed without difficulty. The                            patient tolerated the procedure well. The quality                            of the bowel preparation was evaluated using the                            BBPS [facility] Bowel Preparation Scale) with scores                             of: Right Colon = 3, Transverse Colon = 3 and Left                            Colon = 3 (entire mucosa seen well with no residual                            staining, small fragments of stool or opaque                            liquid). The total BBPS score equals 9. Scope In: 10:26:01 AM Scope Out: 10:39:10 AM Scope Withdrawal Time: 0 hours 8 minutes 5 seconds  Total Procedure Duration: 0 hours 13 minutes 9 seconds  Findings:      The perianal and digital rectal examinations were normal.      Non-bleeding internal hemorrhoids were found during endoscopy.      Multiple small-mouthed diverticula were found in the sigmoid colon and       descending colon.      Two sessile polyps were found in the sigmoid colon. The polyps were 2 to       3 mm in size. These polyps were removed with a cold snare. Resection and       retrieval were complete.      The exam was otherwise without abnormality. Impression:               - Non-bleeding internal hemorrhoids.                           - Diverticulosis in the sigmoid colon and in the                            descending colon.                           - Two 2 to 3 mm polyps in the sigmoid colon,                            removed with a cold snare. Resected and retrieved.                           -  The examination was otherwise normal. Moderate Sedation:      Per Anesthesia Care Recommendation:           - Patient has a contact number available for                            emergencies. The signs and symptoms of potential                            delayed complications were discussed with the                            patient. Return to normal activities tomorrow.                            Written discharge instructions were provided to the                            patient.                           - Resume previous diet.                           - Continue present medications.                           - Await pathology  results.                           - Repeat colonoscopy in 5 years for surveillance.                           - Return to GI clinic PRN. Procedure Code(s):        --- Professional ---                           [phone], Colonoscopy, flexible; with removal of                            tumor(s), polyp(s), or other lesion(s) by snare                            technique Diagnosis Code(s):        --- Professional ---                           Z86.010, Personal history of colonic polyps                           K64.8, Other hemorrhoids                           K63.5, Polyp of colon                           Z80.0, Family history of malignant neoplasm of  digestive organs                           K57.30, Diverticulosis of large intestine without                            perforation or abscess without bleeding CPT copyright 2019 American Medical Association. All rights reserved. The codes documented in this report are preliminary and upon coder review may  be revised to meet current compliance requirements. Elon Alas. Abbey Chatters, DO [place] Abbey Chatters, DO 03/14/2020 10:41:39 AM This report has been signed electronically. Number of Addenda: 0

## 2020-03-14 NOTE — Anesthesia Procedure Notes (Signed)
Date/Time: 03/14/2020 10:30 AM Performed by: Orlie Dakin, CRNA Pre-anesthesia Checklist: Patient identified, Emergency Drugs available, Suction available and Patient being monitored Patient Re-evaluated:Patient Re-evaluated prior to induction Oxygen Delivery Method: Nasal cannula Induction Type: IV induction Placement Confirmation: positive ETCO2

## 2020-03-14 NOTE — Anesthesia Postprocedure Evaluation (Signed)
Anesthesia Post Note  Patient: Rodney Stone  Procedure(s) Performed: COLONOSCOPY WITH PROPOFOL (N/A ) POLYPECTOMY  Patient location during evaluation: Endoscopy Anesthesia Type: General Level of consciousness: awake and alert and oriented Pain management: pain level controlled Vital Signs Assessment: post-procedure vital signs reviewed and stable Respiratory status: spontaneous breathing, nonlabored ventilation and respiratory function stable Cardiovascular status: blood pressure returned to baseline and stable Postop Assessment: no apparent nausea or vomiting Anesthetic complications: no   No complications documented.   Last Vitals:  Vitals:   03/14/20 0832 03/14/20 1042  BP: (!) 176/96 119/71  Pulse:    Resp:  19  Temp:  36.5 C  SpO2:  95%    Last Pain:  Vitals:   03/14/20 1042  TempSrc: Oral  PainSc: 0-No pain                 Orlie Dakin

## 2020-03-14 NOTE — Discharge Instructions (Addendum)
Colonoscopy Discharge Instructions  Read the instructions outlined below and refer to this sheet in the next few weeks. These discharge instructions provide you with general information on caring for yourself after you leave the hospital. Your doctor may also give you specific instructions. While your treatment has been planned according to the most current medical practices available, unavoidable complications occasionally occur.   ACTIVITY  You may resume your regular activity, but move at a slower pace for the next 24 hours.   Take frequent rest periods for the next 24 hours.   Walking will help get rid of the air and reduce the bloated feeling in your belly (abdomen).   No driving for 24 hours (because of the medicine (anesthesia) used during the test).    Do not sign any important legal documents or operate any machinery for 24 hours (because of the anesthesia used during the test).  NUTRITION  Drink plenty of fluids.   You may resume your normal diet as instructed by your doctor.   Begin with a light meal and progress to your normal diet. Heavy or fried foods are harder to digest and may make you feel sick to your stomach (nauseated).   Avoid alcoholic beverages for 24 hours or as instructed.  MEDICATIONS  You may resume your normal medications unless your doctor tells you otherwise.  WHAT YOU CAN EXPECT TODAY  Some feelings of bloating in the abdomen.   Passage of more gas than usual.   Spotting of blood in your stool or on the toilet paper.  IF YOU HAD POLYPS REMOVED DURING THE COLONOSCOPY:  No aspirin products for 7 days or as instructed.   No alcohol for 7 days or as instructed.   Eat a soft diet for the next 24 hours.  FINDING OUT THE RESULTS OF YOUR TEST Not all test results are available during your visit. If your test results are not back during the visit, make an appointment with your caregiver to find out the results. Do not assume everything is normal if  you have not heard from your caregiver or the medical facility. It is important for you to follow up on all of your test results.  SEEK IMMEDIATE MEDICAL ATTENTION IF:  You have more than a spotting of blood in your stool.   Your belly is swollen (abdominal distention).   You are nauseated or vomiting.   You have a temperature over 101.   You have abdominal pain or discomfort that is severe or gets worse throughout the day.   Your colonoscopy revealed 2 polyp(s) which I removed successfully. Await pathology results, my office will contact you. I recommend repeating colonoscopy in 5 years for surveillance purposes.    I hope you have a great rest of your week!  Elon Alas. Abbey Chatters, D.O. Gastroenterology and Hepatology [facility]   Colon Polyps  Polyps are tissue growths inside the body. Polyps can grow in many places, including the large intestine (colon). A polyp may be a round bump or a mushroom-shaped growth. You could have one polyp or several. Most colon polyps are noncancerous (benign). However, some colon polyps can become cancerous over time. Finding and removing the polyps early can help prevent this. What are the causes? The exact cause of colon polyps is not known. What increases the risk? You are more likely to develop this condition if you:  Have a family history of colon cancer or colon polyps.  Are older than 50 or older than  67 if you are African American.  Have inflammatory bowel disease, such as ulcerative colitis or Crohn's disease.  Have certain hereditary conditions, such as: ? Familial adenomatous polyposis. ? Lynch syndrome. ? Turcot syndrome. ? Peutz-Jeghers syndrome.  Are overweight.  Smoke cigarettes.  Do not get enough exercise.  Drink too much alcohol.  Eat a diet that is high in fat and red meat and low in fiber.  Had childhood cancer that was treated with abdominal radiation. What are the signs or  symptoms? Most polyps do not cause symptoms. If you have symptoms, they may include:  Blood coming from your rectum when having a bowel movement.  Blood in your stool. The stool may look dark red or black.  Abdominal pain.  A change in bowel habits, such as constipation or diarrhea. How is this diagnosed? This condition is diagnosed with a colonoscopy. This is a procedure in which a lighted, flexible scope is inserted into the anus and then passed into the colon to examine the area. Polyps are sometimes found when a colonoscopy is done as part of routine cancer screening tests. How is this treated? Treatment for this condition involves removing any polyps that are found. Most polyps can be removed during a colonoscopy. Those polyps will then be tested for cancer. Additional treatment may be needed depending on the results of testing. Follow these instructions at home: Lifestyle  Maintain a healthy weight, or lose weight if recommended by your health care provider.  Exercise every day or as told by your health care provider.  Do not use any products that contain nicotine or tobacco, such as cigarettes and e-cigarettes. If you need help quitting, ask your health care provider.  If you drink alcohol, limit how much you have: ? 0-1 drink a day for women. ? 0-2 drinks a day for men.  Be aware of how much alcohol is in your drink. In the [place] one drink equals one 12 oz bottle of beer (355 mL), one 5 oz glass of wine (148 mL), or one 1 oz shot of hard liquor (44 mL). Eating and drinking   Eat foods that are high in fiber, such as fruits, vegetables, and whole grains.  Eat foods that are high in calcium and vitamin D, such as milk, cheese, yogurt, eggs, liver, fish, and broccoli.  Limit foods that are high in fat, such as fried foods and desserts.  Limit the amount of red meat and processed meat you eat, such as hot dogs, sausage, bacon, and lunch meats. General instructions  Keep  all follow-up visits as told by your health care provider. This is important. ? This includes having regularly scheduled colonoscopies. ? Talk to your health care provider about when you need a colonoscopy. Contact a health care provider if:  You have new or worsening bleeding during a bowel movement.  You have new or increased blood in your stool.  You have a change in bowel habits.  You lose weight for no known reason. Summary  Polyps are tissue growths inside the body. Polyps can grow in many places, including the colon.  Most colon polyps are noncancerous (benign), but some can become cancerous over time.  This condition is diagnosed with a colonoscopy.  Treatment for this condition involves removing any polyps that are found. Most polyps can be removed during a colonoscopy. This information is not intended to replace advice given to you by your health care provider. Make sure you discuss any questions you have with  your health care provider. Document Revised: 08/04/2017 Document Reviewed: 08/04/2017 Elsevier Patient Education  [place].

## 2020-03-14 NOTE — Transfer of Care (Signed)
Immediate Anesthesia Transfer of Care Note  Patient: Rodney Stone  Procedure(s) Performed: COLONOSCOPY WITH PROPOFOL (N/A ) POLYPECTOMY  Patient Location: Endoscopy Unit  Anesthesia Type:General  Level of Consciousness: drowsy  Airway & Oxygen Therapy: Patient Spontanous Breathing  Post-op Assessment: Report given to RN and Post -op Vital signs reviewed and stable  Post vital signs: Reviewed and stable  Last Vitals:  Vitals Value Taken Time  BP 119/71 03/14/20 1042  Temp 36.5 C 03/14/20 1042  Pulse    Resp 19 03/14/20 1042  SpO2 95 % 03/14/20 1042    Last Pain:  Vitals:   03/14/20 1042  TempSrc: Oral  PainSc: 0-No pain         Complications: No complications documented.

## 2020-03-14 NOTE — Anesthesia Preprocedure Evaluation (Addendum)
Anesthesia Evaluation  Patient identified by MRN, date of birth, ID band Patient awake    Reviewed: Allergy & Precautions, NPO status , Patient's Chart, lab work & pertinent test results  History of Anesthesia Complications Negative for: history of anesthetic complications  Airway Mallampati: II  TM Distance: >3 FB Neck ROM: Full    Dental  (+) Edentulous Upper, Edentulous Lower   Pulmonary COPD,  COPD inhaler, Current SmokerPatient did not abstain from smoking.,    Pulmonary exam normal breath sounds clear to auscultation       Cardiovascular Exercise Tolerance: Good hypertension, Pt. on medications Normal cardiovascular exam Rhythm:Regular Rate:Normal - Systolic murmurs, - Diastolic murmurs, - Friction Rub, - Carotid Bruit, - Peripheral Edema and - Systolic Click    Neuro/Psych Anxiety negative neurological ROS  negative psych ROS   GI/Hepatic Neg liver ROS, GERD  Medicated,  Endo/Other  negative endocrine ROS  Renal/GU negative Renal ROS     Musculoskeletal  (+) Arthritis , Osteoarthritis,    Abdominal   Peds  Hematology negative hematology ROS (+)   Anesthesia Other Findings   Reproductive/Obstetrics                            Anesthesia Physical Anesthesia Plan  ASA: II  Anesthesia Plan: General   Post-op Pain Management:    Induction: Intravenous  PONV Risk Score and Plan: TIVA  Airway Management Planned: Nasal Cannula and Natural Airway  Additional Equipment:   Intra-op Plan:   Post-operative Plan:   Informed Consent: I have reviewed the patients History and Physical, chart, labs and discussed the procedure including the risks, benefits and alternatives for the proposed anesthesia with the patient or authorized representative who has indicated his/her understanding and acceptance.     Dental advisory given  Plan Discussed with: CRNA and Surgeon  Anesthesia Plan  Comments:         Anesthesia Quick Evaluation

## 2020-03-14 NOTE — Interval H&P Note (Signed)
History and Physical Interval Note:  03/14/2020 8:46 AM  Rodney Stone  has presented today for surgery, with the diagnosis of fh crc, hx colon polyps.  The various methods of treatment have been discussed with the patient and family. After consideration of risks, benefits and other options for treatment, the patient has consented to  Procedure(s) with comments: COLONOSCOPY WITH PROPOFOL (N/A) - 10:00am as a surgical intervention.  The patient's history has been reviewed, patient examined, no change in status, stable for surgery.  I have reviewed the patient's chart and labs.  Questions were answered to the patient's satisfaction.     Eloise Harman

## 2020-03-17 LAB — SURGICAL PATHOLOGY

## 2020-03-21 ENCOUNTER — Encounter (HOSPITAL_COMMUNITY): Payer: Self-pay | Admitting: Internal Medicine

## 2020-07-23 ENCOUNTER — Other Ambulatory Visit: Payer: Self-pay | Admitting: Nurse Practitioner

## 2020-07-23 ENCOUNTER — Other Ambulatory Visit (HOSPITAL_COMMUNITY): Payer: Self-pay | Admitting: Nurse Practitioner

## 2020-07-23 DIAGNOSIS — Z720 Tobacco use: Secondary | ICD-10-CM

## 2020-08-15 ENCOUNTER — Ambulatory Visit (HOSPITAL_COMMUNITY): Payer: Medicare Other

## 2020-08-15 ENCOUNTER — Encounter (HOSPITAL_COMMUNITY): Payer: Self-pay

## 2020-08-19 ENCOUNTER — Ambulatory Visit (HOSPITAL_COMMUNITY)
Admission: RE | Admit: 2020-08-19 | Discharge: 2020-08-19 | Disposition: A | Discharge status: Home / Self Care | Payer: Medicare Other | Source: Ambulatory Visit | Attending: Nurse Practitioner | Admitting: Nurse Practitioner

## 2020-08-19 ENCOUNTER — Other Ambulatory Visit: Payer: Self-pay

## 2020-08-19 DIAGNOSIS — J439 Emphysema, unspecified: Secondary | ICD-10-CM | POA: Insufficient documentation

## 2020-08-19 DIAGNOSIS — I2510 Atherosclerotic heart disease of native coronary artery without angina pectoris: Secondary | ICD-10-CM | POA: Insufficient documentation

## 2020-08-19 DIAGNOSIS — Z122 Encounter for screening for malignant neoplasm of respiratory organs: Secondary | ICD-10-CM | POA: Diagnosis not present

## 2020-08-19 DIAGNOSIS — Z720 Tobacco use: Secondary | ICD-10-CM

## 2020-08-19 DIAGNOSIS — F17210 Nicotine dependence, cigarettes, uncomplicated: Secondary | ICD-10-CM | POA: Diagnosis not present

## 2020-08-19 DIAGNOSIS — I700 Atherosclerosis of aorta: Secondary | ICD-10-CM | POA: Insufficient documentation

## 2021-09-07 ENCOUNTER — Other Ambulatory Visit: Payer: Self-pay | Admitting: Nurse Practitioner

## 2021-09-07 ENCOUNTER — Other Ambulatory Visit (HOSPITAL_COMMUNITY): Payer: Self-pay | Admitting: Nurse Practitioner

## 2021-09-07 DIAGNOSIS — Z720 Tobacco use: Secondary | ICD-10-CM

## 2021-09-25 ENCOUNTER — Ambulatory Visit (HOSPITAL_COMMUNITY)
Admission: RE | Admit: 2021-09-25 | Discharge: 2021-09-25 | Disposition: A | Discharge status: Home / Self Care | Payer: Medicare Other | Source: Ambulatory Visit | Attending: Nurse Practitioner | Admitting: Nurse Practitioner

## 2021-09-25 DIAGNOSIS — F17210 Nicotine dependence, cigarettes, uncomplicated: Secondary | ICD-10-CM | POA: Insufficient documentation

## 2021-09-25 DIAGNOSIS — Z122 Encounter for screening for malignant neoplasm of respiratory organs: Secondary | ICD-10-CM | POA: Diagnosis present

## 2021-09-25 DIAGNOSIS — Z720 Tobacco use: Secondary | ICD-10-CM | POA: Insufficient documentation

## 2021-09-25 DIAGNOSIS — I700 Atherosclerosis of aorta: Secondary | ICD-10-CM | POA: Insufficient documentation

## 2021-09-25 DIAGNOSIS — J439 Emphysema, unspecified: Secondary | ICD-10-CM | POA: Diagnosis not present

## 2021-10-27 ENCOUNTER — Other Ambulatory Visit: Payer: Self-pay | Admitting: Nurse Practitioner

## 2021-10-27 ENCOUNTER — Other Ambulatory Visit (HOSPITAL_COMMUNITY): Payer: Self-pay | Admitting: Nurse Practitioner

## 2021-10-27 DIAGNOSIS — R9389 Abnormal findings on diagnostic imaging of other specified body structures: Secondary | ICD-10-CM

## 2021-12-10 ENCOUNTER — Ambulatory Visit (HOSPITAL_COMMUNITY)
Admission: RE | Admit: 2021-12-10 | Discharge: 2021-12-10 | Disposition: A | Discharge status: Home / Self Care | Payer: Medicare Other | Source: Ambulatory Visit | Attending: Nurse Practitioner | Admitting: Nurse Practitioner

## 2021-12-10 DIAGNOSIS — I700 Atherosclerosis of aorta: Secondary | ICD-10-CM | POA: Insufficient documentation

## 2021-12-10 DIAGNOSIS — D3501 Benign neoplasm of right adrenal gland: Secondary | ICD-10-CM | POA: Insufficient documentation

## 2021-12-10 DIAGNOSIS — D3502 Benign neoplasm of left adrenal gland: Secondary | ICD-10-CM | POA: Diagnosis not present

## 2021-12-10 DIAGNOSIS — R9389 Abnormal findings on diagnostic imaging of other specified body structures: Secondary | ICD-10-CM | POA: Insufficient documentation

## 2021-12-10 DIAGNOSIS — J439 Emphysema, unspecified: Secondary | ICD-10-CM | POA: Diagnosis not present

## 2021-12-10 DIAGNOSIS — F17210 Nicotine dependence, cigarettes, uncomplicated: Secondary | ICD-10-CM | POA: Diagnosis not present

## 2022-05-04 ENCOUNTER — Telehealth: Payer: Self-pay | Admitting: Gastroenterology

## 2022-05-04 NOTE — Telephone Encounter (Signed)
Patient's sister was seen in the office. She inquired why patient did not have same instructions as her for next colonoscopy in five years given FH of colon cancer at age less than 9 (mother). I spoke with Dr. Abbey Chatters, patient does need to have next colonoscopy five year from last one, it was a typo and he was appreciative that sister inquired.   Please let pt know that his next colonoscopy should be in 03/2025 (not in 10 years).   Please update NIC.

## 2022-05-04 NOTE — Telephone Encounter (Signed)
Pt was made aware.  

## 2022-05-04 NOTE — Telephone Encounter (Signed)
Lmom for pt to return my call.  

## 2022-10-01 IMAGING — CT CT CHEST LUNG CANCER SCREENING LOW DOSE W/O CM
2 of 4 series · 15 of 36 positions shown, 18 images · non-contrast
Comparison: Lung cancer screening CT dated August 19, 2020

CLINICAL DATA: Current smoker with 51 pack-year history



[Series 4: lungs · axial · 0.70mm/px · z∈[+1324,+1632]mm · 12 of 340 slices shown, 15 images]
[im 16/340  mediastinal]
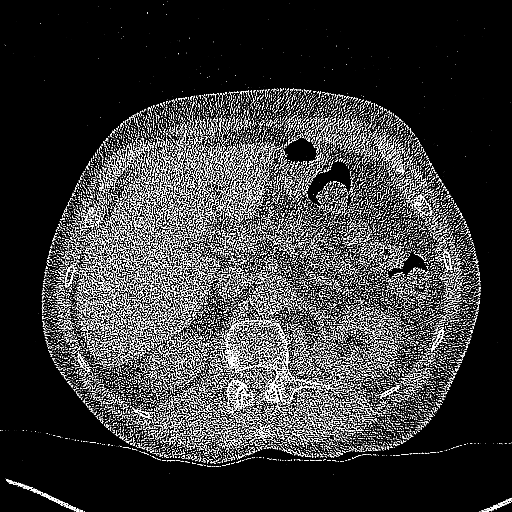
[im 16/340  lung]
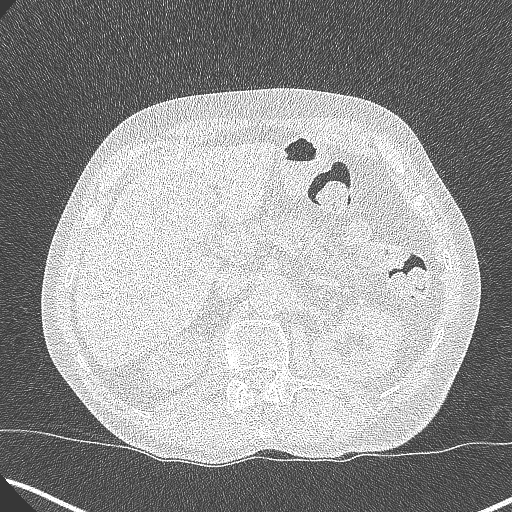
[im 47/340  lung]
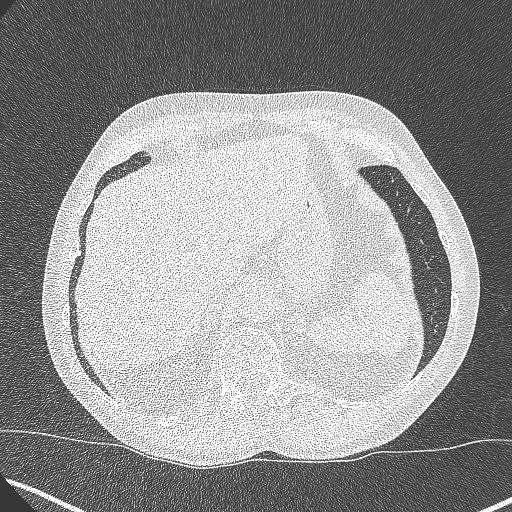
[im 78/340  lung]
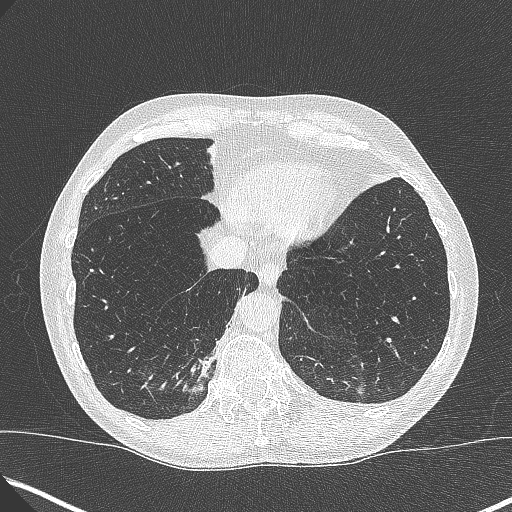
[im 108/340  lung]
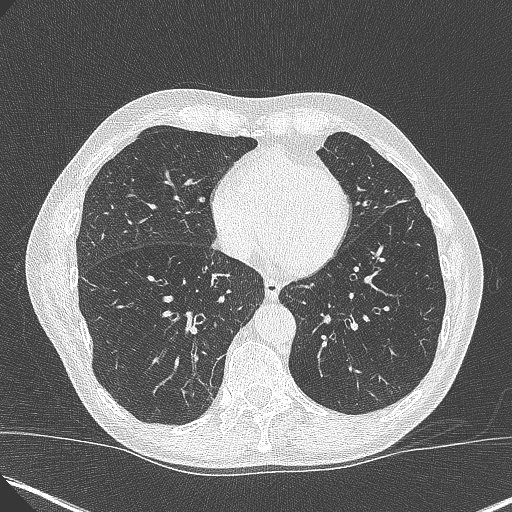
[im 124/340  mediastinal]
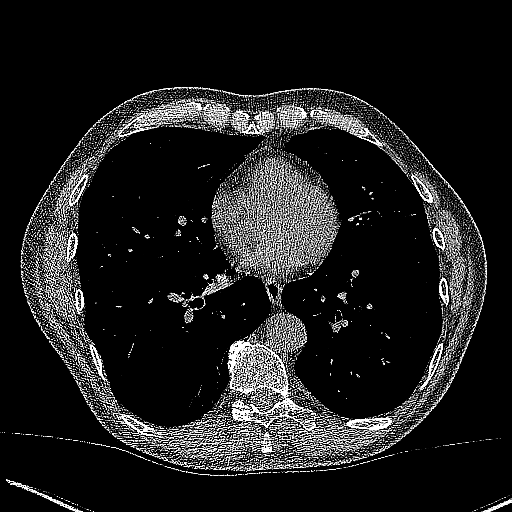
[im 124/340  lung]
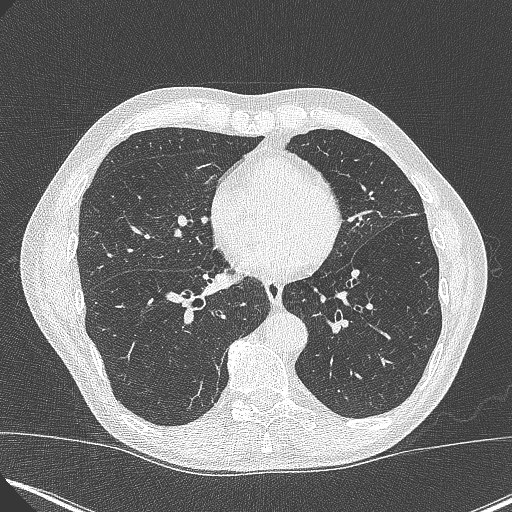
[im 155/340  lung]
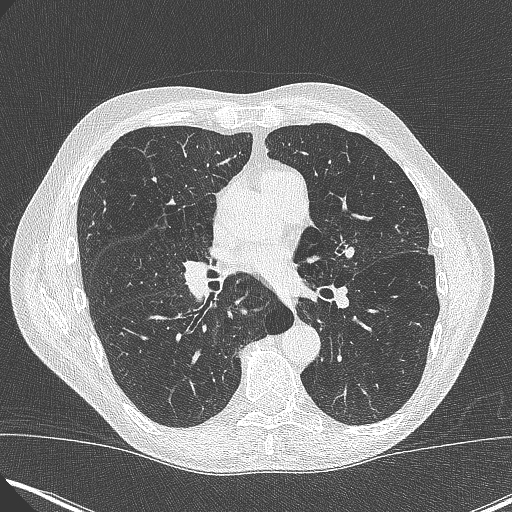
[im 185/340  lung]
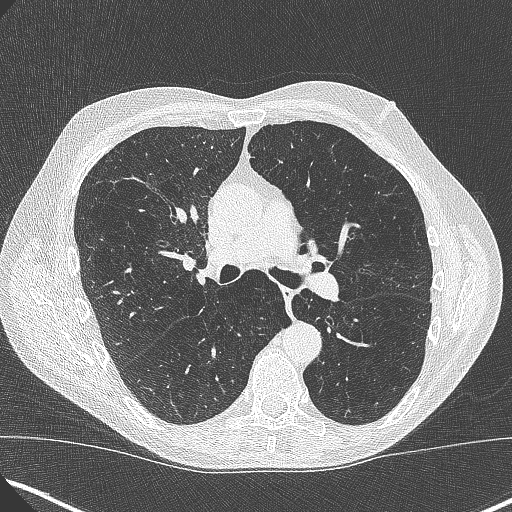
[im 216/340  lung]
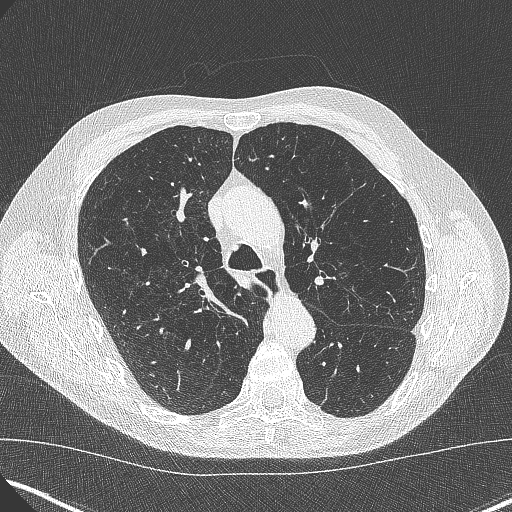
[im 232/340  mediastinal]
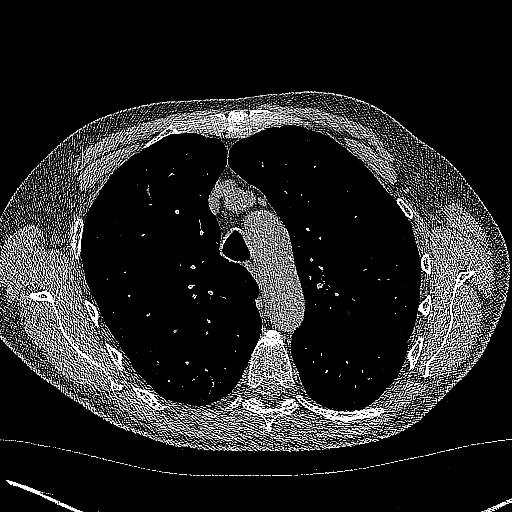
[im 232/340  lung]
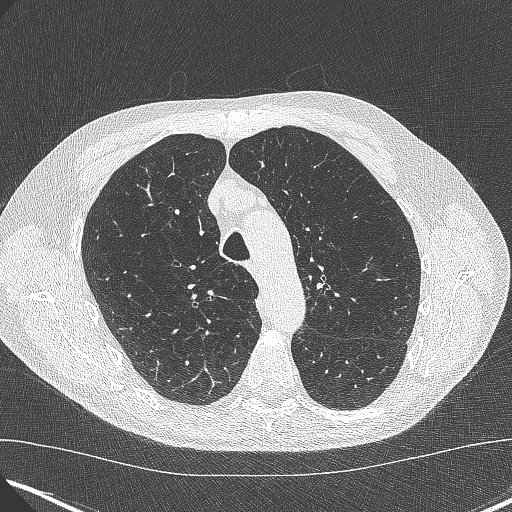
[im 262/340  lung]
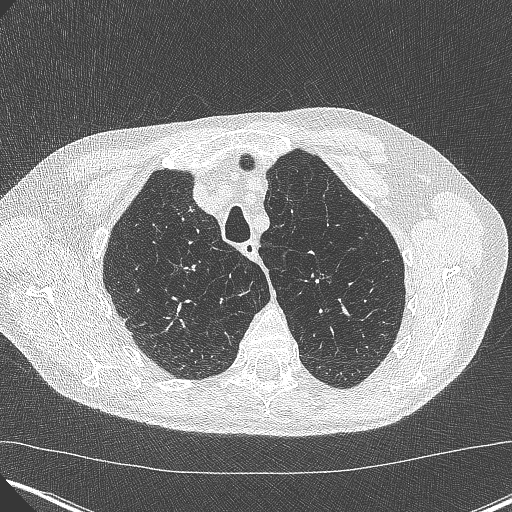
[im 293/340  lung]
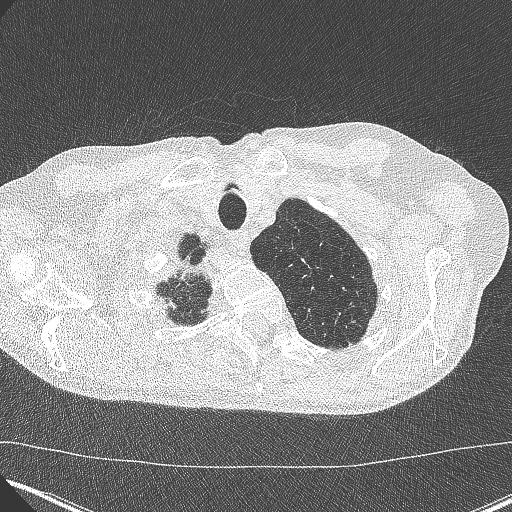
[im 324/340  lung]
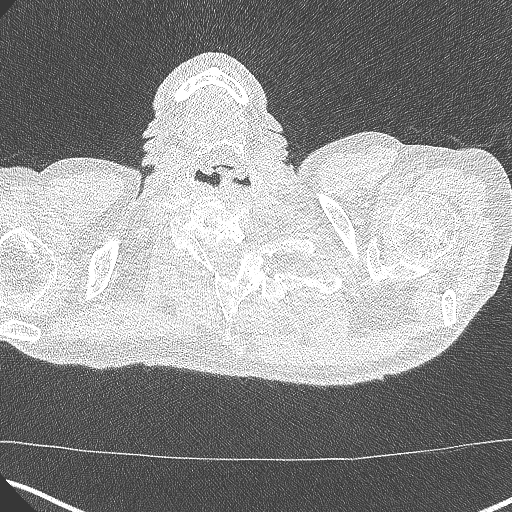

[Series 5: coronal · coronal · 0.68mm/px · 3 of 289 slices shown]
[im 58/289  lung]
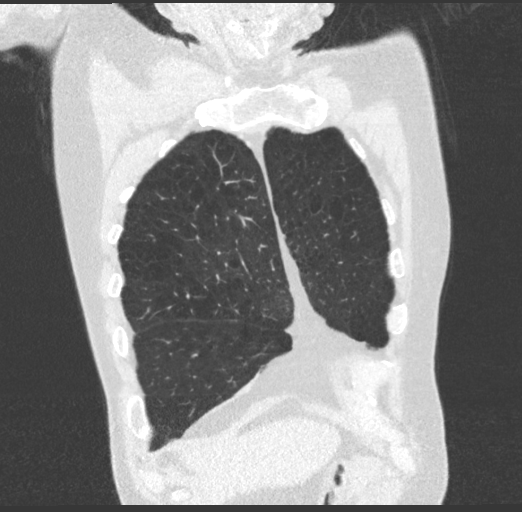
[im 116/289  lung]
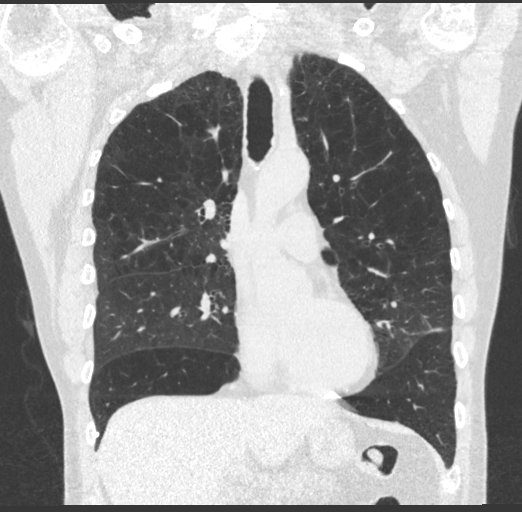
[im 173/289  lung]
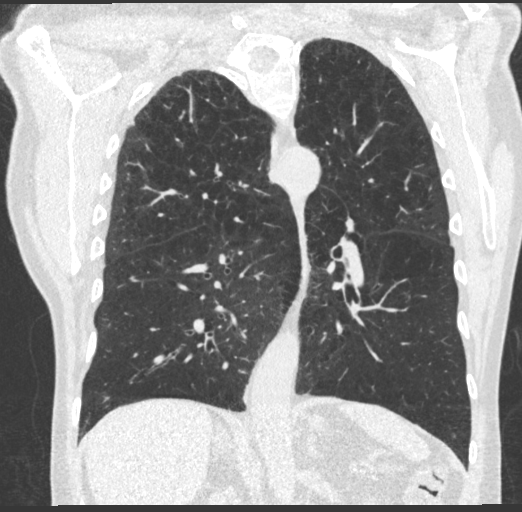

[15 of 36 positions shown; findings below may reference images not displayed]

FINDINGS: Cardiovascular: Normal heart size. No pericardial effusion. No
significant coronary artery calcifications. Atherosclerotic disease
of the thoracic aorta.

Mediastinum/Nodes: Esophagus and thyroid are unremarkable. No
pathologically enlarged lymph nodes seen in the chest.

Lungs/Pleura: Central airways are patent. Severe centrilobular
emphysema. Mild linear opacities of the right lower lobe and
lingula, likely due to scarring or atelectasis. New nodule of the
right anterior wall of the trachea measuring 4.5 mm on image 125.
New tiny solid pulmonary nodule of the left lower lobe measuring
mm in mean diameter on image 258. Additional previously seen solid
pulmonary nodules are stable.

Upper Abdomen: Stable bilateral low-attenuation adrenal nodules
compatible with benign adenomas.

Musculoskeletal: No chest wall mass or suspicious bone lesions
identified.
IMPRESSION: 1. New nodule of the right anterior wall of the trachea measuring
4.5 mm, possibly due to adherent debris. Follow-up CT is recommended
to ensure resolution. Lung-RADS 4A, suspicious. Follow up low-dose
chest CT without contrast in 3 months (please use the following
order, CT CHEST LCS NODULE FOLLOW-UP W/O CM) is recommended.
Alternatively, PET may be considered when there is a solid component
8mm or larger.
2. Aortic Atherosclerosis (U10BK-2EB.B) and Emphysema (U10BK-C8W.2).

## 2022-11-09 ENCOUNTER — Other Ambulatory Visit: Payer: Self-pay

## 2022-11-09 ENCOUNTER — Emergency Department (HOSPITAL_COMMUNITY): Payer: 59

## 2022-11-09 ENCOUNTER — Encounter (HOSPITAL_COMMUNITY): Payer: Self-pay | Admitting: Emergency Medicine

## 2022-11-09 ENCOUNTER — Emergency Department (HOSPITAL_COMMUNITY)
Admission: EM | Admit: 2022-11-09 | Discharge: 2022-11-09 | Disposition: A | Discharge status: Home / Self Care | Payer: 59 | Attending: Student | Admitting: Student

## 2022-11-09 DIAGNOSIS — K529 Noninfective gastroenteritis and colitis, unspecified: Secondary | ICD-10-CM | POA: Insufficient documentation

## 2022-11-09 DIAGNOSIS — R1032 Left lower quadrant pain: Secondary | ICD-10-CM | POA: Diagnosis present

## 2022-11-09 DIAGNOSIS — Z79899 Other long term (current) drug therapy: Secondary | ICD-10-CM | POA: Diagnosis not present

## 2022-11-09 DIAGNOSIS — F17210 Nicotine dependence, cigarettes, uncomplicated: Secondary | ICD-10-CM | POA: Insufficient documentation

## 2022-11-09 DIAGNOSIS — I10 Essential (primary) hypertension: Secondary | ICD-10-CM | POA: Insufficient documentation

## 2022-11-09 LAB — COMPREHENSIVE METABOLIC PANEL
ALT: 22 U/L (ref 0–44)
AST: 25 U/L (ref 15–41)
Albumin: 4 g/dL (ref 3.5–5.0)
Alkaline Phosphatase: 70 U/L (ref 38–126)
Anion gap: 13 (ref 5–15)
BUN: 23 mg/dL (ref 8–23)
CO2: 22 mmol/L (ref 22–32)
Calcium: 9.1 mg/dL (ref 8.9–10.3)
Chloride: 101 mmol/L (ref 98–111)
Creatinine, Ser: 1.1 mg/dL (ref 0.61–1.24)
GFR, Estimated: >60 mL/min (ref >60)
Glucose, Bld: 112 mg/dL — ABNORMAL HIGH (ref 70–99)
Potassium: 4.5 mmol/L (ref 3.5–5.1)
Sodium: 136 mmol/L (ref 135–145)
Total Bilirubin: 1 mg/dL (ref 0.3–1.2)
Total Protein: 6.7 g/dL (ref 6.5–8.1)

## 2022-11-09 LAB — TYPE AND SCREEN
ABO/RH(D): O POS
Antibody Screen: NEGATIVE

## 2022-11-09 LAB — CBC
HCT: 45.8 % (ref 39.0–52.0)
Hemoglobin: 15.3 g/dL (ref 13.0–17.0)
MCH: 31.5 pg (ref 26.0–34.0)
MCHC: 33.4 g/dL (ref 30.0–36.0)
MCV: 94.4 fL (ref 80.0–100.0)
Platelets: 220 10*3/uL (ref 150–400)
RBC: 4.85 MIL/uL (ref 4.22–5.81)
RDW: 12.3 % (ref 11.5–15.5)
WBC: 22.3 10*3/uL — ABNORMAL HIGH (ref 4.0–10.5)
nRBC: 0 % (ref 0.0–0.2)

## 2022-11-09 LAB — URINALYSIS, ROUTINE W REFLEX MICROSCOPIC
Bacteria, UA: NONE SEEN
Bilirubin Urine: NEGATIVE
Glucose, UA: NEGATIVE mg/dL
Ketones, ur: NEGATIVE mg/dL
Leukocytes,Ua: NEGATIVE
Nitrite: NEGATIVE
Protein, ur: NEGATIVE mg/dL
Specific Gravity, Urine: 1.009 (ref 1.005–1.030)
pH: 6 (ref 5.0–8.0)

## 2022-11-09 LAB — LIPASE, BLOOD: Lipase: 23 U/L (ref 11–51)

## 2022-11-09 LAB — POC OCCULT BLOOD, ED: Fecal Occult Bld: POSITIVE — AB

## 2022-11-09 MED ORDER — AMOXICILLIN-POT CLAVULANATE 875-125 MG PO TABS
1.0000 | ORAL_TABLET | Freq: Once | ORAL | Status: AC
  Administered 2022-11-09: 1 via ORAL
  Filled 2022-11-09: qty 1

## 2022-11-09 MED ORDER — IOHEXOL 300 MG/ML  SOLN
100.0000 mL | Freq: Once | INTRAMUSCULAR | Status: AC | PRN
  Administered 2022-11-09: 100 mL via INTRAVENOUS

## 2022-11-09 MED ORDER — LACTATED RINGERS IV BOLUS
1000.0000 mL | Freq: Once | INTRAVENOUS | Status: AC
  Administered 2022-11-09: 1000 mL via INTRAVENOUS

## 2022-11-09 MED ORDER — AMOXICILLIN-POT CLAVULANATE 875-125 MG PO TABS: 1.0000 | ORAL_TABLET | Freq: Two times a day (BID) | ORAL | 0 refills | Status: AC

## 2022-11-09 NOTE — ED Triage Notes (Addendum)
Pt via POV c/o 5/10 LLQ abd pain and bright red hematochezia this morning. Pt feels fatigued and thinks he may be dehydrated. Denies hx hemorrhoids. Denies n/v/d/c. Tachycardic in triage with HR 120, other vitals WNL.

## 2022-11-09 NOTE — ED Notes (Signed)
Pt given urinal and informed of need for urine specimen  

## 2022-11-09 NOTE — ED Notes (Signed)
POC occult blood positive--MD made aware

## 2022-11-09 NOTE — ED Provider Notes (Signed)
[facility] EMERGENCY DEPARTMENT AT [facility] Provider Note  CSN: [id number] Arrival date & time: 11/09/22 1129  Chief Complaint(s) Abdominal Pain and Rectal Bleeding  HPI DELRAY Stone is a 72 y.o. male with PMH anxiety, HTN, HLD, chronic opioid use, internal hemorrhoids who presents emergency room for evaluation of abdominal pain and rectal bleeding.  Patient states that he had felt very constipated recently and had a bowel movement yesterday.  He then had an episode of left lower quadrant pain that has since resolved but when he went to use the bathroom this morning, he noticed bright red blood on the toilet paper.  He currently denies any abdominal pain, nausea, vomiting, chest pain, shortness of breath or other systemic symptoms.  Patient does arrive tachycardic and states he feels dehydrated.   Past Medical History Past Medical History:  Diagnosis Date   Anxiety    Arthritis    Hyperlipidemia    Hypertension    Patient Active Problem List   Diagnosis Date Noted   History of adenomatous polyp of colon 02/20/2020   Family history of colon cancer in mother 02/20/2020   GERD (gastroesophageal reflux disease) 02/20/2020   Home Medication(s) Prior to Admission medications   Medication Sig Start Date End Date Taking? Authorizing Provider  amoxicillin-clavulanate (AUGMENTIN) 875-125 MG tablet Take 1 tablet by mouth every 12 (twelve) hours for 5 days. 11/09/22 11/14/22 Yes Earlie Schank, MD  albuterol (VENTOLIN HFA) 108 (90 Base) MCG/ACT inhaler Inhale 2 puffs into the lungs every 4 (four) hours as needed for wheezing or shortness of breath.  02/10/20   [provider]  buPROPion (WELLBUTRIN XL) 300 MG 24 hr tablet Take 300 mg by mouth every morning. 12/02/19   [provider]  Cholecalciferol (VITAMIN D3) 50 MCG (2000 UT) TABS Take 2,000 Units by mouth daily.     [provider]  clonazePAM (KLONOPIN) 0.5 MG tablet Take 0.5 mg by mouth 3 (three)  times daily as needed for anxiety.  01/25/20   [provider]  lisinopril (ZESTRIL) 20 MG tablet Take 20 mg by mouth daily. 01/10/20   [provider]  naproxen sodium (ALEVE) 220 MG tablet Take 220 mg by mouth daily as needed (headache).     [provider]  oxyCODONE-acetaminophen (PERCOCET) 7.5-325 MG tablet Take 1 tablet by mouth every 6 (six) hours as needed for moderate pain or severe pain.  01/25/20   [provider]  simvastatin (ZOCOR) 20 MG tablet Take 20 mg by mouth at bedtime. 02/12/20   [provider]                                                                                                                                    Past Surgical History Past Surgical History:  Procedure Laterality Date   COLONOSCOPY     COLONOSCOPY WITH PROPOFOL N/A 03/14/2020   Procedure: COLONOSCOPY WITH PROPOFOL;  Surgeon:  Lanelle Bal, DO;  [facility]: [facility];  Service: Endoscopy;  Laterality: N/A;  10:00am   POLYPECTOMY  03/14/2020   Procedure: POLYPECTOMY;  Surgeon: Lanelle Bal, DO;  Location: [facility];  Service: Endoscopy;;   Family History Family History  Problem Relation Age of Onset   Colon cancer Mother    Stroke Father     Social History Social History   Tobacco Use   Smoking status: Every Day    Packs/day: .5    Types: Cigarettes   Smokeless tobacco: Never  Vaping Use   Vaping Use: Never used  Substance Use Topics   Alcohol use: No   Drug use: No   Allergies Patient has no known allergies.  Review of Systems Review of Systems  Gastrointestinal:  Positive for abdominal pain, blood in stool and constipation.    Physical Exam Vital Signs  I have reviewed the triage vital signs BP (!) 142/83   Pulse 97   Temp 97.6 F (36.4 C)   Resp 17   Ht 6' (1.829 m)   Wt 64.4 kg   SpO2 93%   BMI 19.26 kg/m   Physical Exam Constitutional:      General: He is not in acute distress.    Appearance: Normal  appearance.  HENT:     Head: Normocephalic and atraumatic.     Nose: No congestion or rhinorrhea.  Eyes:     General:        Right eye: No discharge.        Left eye: No discharge.     Extraocular Movements: Extraocular movements intact.     Pupils: Pupils are equal, round, and reactive to light.  Cardiovascular:     Rate and Rhythm: Normal rate and regular rhythm.     Heart sounds: No murmur heard. Pulmonary:     Effort: No respiratory distress.     Breath sounds: No wheezing or rales.  Abdominal:     General: There is no distension.     Tenderness: There is abdominal tenderness in the left lower quadrant.  Genitourinary:    Rectum: Guaiac result positive.  Musculoskeletal:        General: Normal range of motion.     Cervical back: Normal range of motion.  Skin:    General: Skin is warm and dry.  Neurological:     General: No focal deficit present.     Mental Status: He is alert.     ED Results and Treatments Labs (all labs ordered are listed, but only abnormal results are displayed) Labs Reviewed  URINALYSIS, ROUTINE W REFLEX MICROSCOPIC - Abnormal; Notable for the following components:      Result Value   Hgb urine dipstick SMALL (*)    All other components within normal limits  COMPREHENSIVE METABOLIC PANEL - Abnormal; Notable for the following components:   Glucose, Bld 112 (*)    All other components within normal limits  CBC - Abnormal; Notable for the following components:   WBC 22.3 (*)    All other components within normal limits  POC OCCULT BLOOD, ED - Abnormal; Notable for the following components:   Fecal Occult Bld POSITIVE (*)    All other components within normal limits  LIPASE, BLOOD  TYPE AND SCREEN  Radiology CT ABDOMEN PELVIS W CONTRAST  Result Date: 11/09/2022 CLINICAL DATA:  Left lower quadrant pain and hematochezia  beginning this morning. EXAM: CT ABDOMEN AND PELVIS WITH CONTRAST TECHNIQUE: Multidetector CT imaging of the abdomen and pelvis was performed using the standard protocol following bolus administration of intravenous contrast. RADIATION DOSE REDUCTION: This exam was performed according to the departmental dose-optimization program which includes automated exposure control, adjustment of the mA and/or kV according to patient size and/or use of iterative reconstruction technique. CONTRAST:  OMNIPAQUE IOHEXOL 300 MG/ML  SOLN COMPARISON:  Chest CT on 09/14/2017 FINDINGS: Lower Chest: No acute findings. Hepatobiliary: No suspicious hepatic masses identified. Gallbladder is unremarkable. No evidence of biliary ductal dilatation. Pancreas:  No mass or inflammatory changes. Spleen: Within normal limits in size and appearance. Adrenals/Urinary Tract: Small bilateral adrenal masses remain stable, consistent with benign adenomas (No followup imaging is recommended). No suspicious renal masses identified. No evidence of ureteral calculi or hydronephrosis. Two small approximately 1 cm enhancing mural nodules are seen along the right posterior bladder wall (see image 62/2), suspicious for bladder carcinoma. Stomach/Bowel: Moderate wall thickening is seen involving the descending and sigmoid colon, consistent with colitis. No evidence of perforation or abscess. Vascular/Lymphatic: No pathologically enlarged lymph nodes. No acute vascular findings. Aortic atherosclerotic calcification incidentally noted. Reproductive:  No mass or other significant abnormality. Other:  None. Musculoskeletal:  No suspicious bone lesions identified. IMPRESSION: Moderate colitis involving the descending and sigmoid colon. No evidence of perforation or abscess. Two small enhancing mural nodules along the right posterior bladder wall, suspicious for bladder carcinoma. Recommend urology consultation and further evaluation with cystoscopy. Aortic  Atherosclerosis (ICD10-I70.0). Electronically Signed   By: Danae Orleans M.D.   On: 11/09/2022 14:34    Pertinent labs & imaging results that were available during my care of the patient were reviewed by me and considered in my medical decision making (see MDM for details).  Medications Ordered in ED Medications  lactated ringers bolus 1,000 mL (0 mLs Intravenous Stopped 11/09/22 1615)  iohexol (OMNIPAQUE) 300 MG/ML solution 100 mL (100 mLs Intravenous Contrast Given 11/09/22 1411)  amoxicillin-clavulanate (AUGMENTIN) 875-125 MG per tablet 1 tablet (1 tablet Oral Given 11/09/22 1610)                                                                                                                                     Procedures Procedures  (including critical care time)  Medical Decision Making / ED Course   This patient presents to the ED for concern of abdominal pain, rectal bleeding, this involves an extensive number of treatment options, and is a complaint that carries with it a high risk of complications and morbidity.  The differential diagnosis includes IBD flare, enteritis, infectious diarrhea, abdominal malignancy, anal fissure, internal hemorrhoid, external hemorrhoid, diverticular bleed, hemangiodysplasia, retained foreign body  MDM: Patient seen emergency room for evaluation of rectal bleeding and left lower  quadrant abdominal pain.  Physical exam with gross hematuria and mild left lower quadrant tenderness to palpation but is otherwise unremarkable.  Laboratory evaluation with a significant leukocytosis to 22.3 but hemoglobin is stable at 15.3.  Hemoccult positive.  CT abdomen pelvis concerning for colitis.  Patient started on Augmentin and on reevaluation patient states he is feeling quite well.  He is hemodynamically stable on reevaluation at this time does not meet inpatient criteria for admission.  He will call his gastroenterologist for follow-up and a 5-day course of Augmentin will be  prescribed for the patient's colitis.  He was given return precautions of which she voiced understanding he was discharged.   Additional history obtained: -Additional history obtained from wife -External records from outside source obtained and reviewed including: Chart review including previous notes, labs, imaging, consultation notes   Lab Tests: -I ordered, reviewed, and interpreted labs.   The pertinent results include:   Labs Reviewed  URINALYSIS, ROUTINE W REFLEX MICROSCOPIC - Abnormal; Notable for the following components:      Result Value   Hgb urine dipstick SMALL (*)    All other components within normal limits  COMPREHENSIVE METABOLIC PANEL - Abnormal; Notable for the following components:   Glucose, Bld 112 (*)    All other components within normal limits  CBC - Abnormal; Notable for the following components:   WBC 22.3 (*)    All other components within normal limits  POC OCCULT BLOOD, ED - Abnormal; Notable for the following components:   Fecal Occult Bld POSITIVE (*)    All other components within normal limits  LIPASE, BLOOD  TYPE AND SCREEN     Imaging Studies ordered: I ordered imaging studies including CTAP I independently visualized and interpreted imaging. I agree with the radiologist interpretation   Medicines ordered and prescription drug management: Meds ordered this encounter  Medications   lactated ringers bolus 1,000 mL   iohexol (OMNIPAQUE) 300 MG/ML solution 100 mL   amoxicillin-clavulanate (AUGMENTIN) 875-125 MG per tablet 1 tablet   amoxicillin-clavulanate (AUGMENTIN) 875-125 MG tablet    Sig: Take 1 tablet by mouth every 12 (twelve) hours for 5 days.    Dispense:  10 tablet    Refill:  0    -I have reviewed the patients home medicines and have made adjustments as needed  Critical interventions none     Cardiac Monitoring: The patient was maintained on a cardiac monitor.  I personally viewed and interpreted the cardiac monitored  which showed an underlying rhythm of: NSR  Social Determinants of Health:  Factors impacting patients care include: none   Reevaluation: After the interventions noted above, I reevaluated the patient and found that they have :improved  Co morbidities that complicate the patient evaluation  Past Medical History:  Diagnosis Date   Anxiety    Arthritis    Hyperlipidemia    Hypertension       Dispostion: I considered admission for this patient, but at this time he does not meet inpatient criteria for admission he is safe for discharge with outpatient follow-up.     Final Clinical Impression(s) / ED Diagnoses Final diagnoses:  Colitis     @PCDICTATION @    Glendora Score, MD 11/09/22 2219

## 2022-11-09 NOTE — ED Notes (Signed)
Patient transported to CT 

## 2022-11-16 NOTE — Progress Notes (Addendum)
Referring Provider: Glendora Score, MD  Primary Care Physician:  Erasmo Downer, NP Primary GI Physician: Dr. Marletta Lor  Chief Complaint  Patient presents with   Blood In Stools    Blood in stools. Was constipated and having some abdominal pain      HPI:   Rodney Stone is a 72 y.o. male presenting today with a history of [name], [name], MD (Rodney Stone, ER physician) for colitis, abdominal pain, rectal bleeding.   Patient was evaluated in the emergency room 11/09/2022 for abdominal pain with rectal bleeding.  Reported feeling very constipated recently, had a bowel movement yesterday, then had an episode of left lower quadrant abdominal pain that had since resolved, but when he went to the bathroom, he noticed bright red blood on toilet paper.  Physical exam  mild LLQ tenderness, stool heme positive.  Labs significant for leukocytosis of 22.3.  CT A/P with moderate wall thickening of the descending and sigmoid colon consistent with colitis.Rodney Stone  He was given a course of Augmentin x 5 days.  Recommended GI follow-up.  Today: Reports having some cough issues with constipation as he is on chronic pain medications for bad arthritis in his back.  He developed some constipation last week.  After constipation broke loose, he developed some left lower quadrant abdominal pain and diarrhea, then started wiping blood that was bright red.  This is why he went to the emergency room.  States diarrhea tapered off the evening and so to the rectal bleeding.  He was found to have colitis in the ER, prescribed 5 days of antibiotics which he took.  States by Saturday, he was feeling much better and is currently feeling 100%.  States he discussed his constipation with his primary care doctor who prescribed something for him which he picked up today, but is not sure what the name of it is.  Aside from constipation, he has no other significant GI symptoms.  Denies nausea, vomiting, reflux symptoms, dysphagia,  unintentional weight loss.  He has basal cell carcinoma on the left side of his face.  States he has to have surgery for this next week and is not sure how long he is going to be down with this.  He wants to hold off on a colonoscopy until he has recovered.  Notably, CT also showed 2 lesions in the right posterior bladder suspicious for bladder carcinoma.  States PCP has referred him to urology.  Reports Mother with colon cancer at 27 y.o.   His last colonoscopy 03/14/2020: Nonbleeding internal hemorrhoids, sigmoid and descending colon diverticulosis, two 2-3 mm polyps in the sigmoid colon resected and retrieved.  Pathology with hyperplastic polyp.  Recommended 10-year repeat.  Past Medical History:  Diagnosis Date   Anxiety    Arthritis    Hyperlipidemia    Hypertension     Past Surgical History:  Procedure Laterality Date   COLONOSCOPY     COLONOSCOPY WITH PROPOFOL N/A 03/14/2020   Surgeon: Lanelle Bal, DO: Nonbleeding internal hemorrhoids, sigmoid and descending colon diverticulosis, two 2-3 mm polyps in the sigmoid colon resected and retrieved.  Pathology with hyperplastic polyp.  Recommended 10-year repeat.   POLYPECTOMY  03/14/2020   Procedure: POLYPECTOMY;  Surgeon: Lanelle Bal, DO;  Location: [facility];  Service: Endoscopy;;    Current Outpatient Medications  Medication Sig Dispense Refill   buPROPion (WELLBUTRIN XL) 300 MG 24 hr tablet Take 300 mg by mouth every morning.     Cholecalciferol (VITAMIN  D3) 50 MCG (2000 UT) TABS Take 2,000 Units by mouth daily.      clonazePAM (KLONOPIN) 0.5 MG tablet Take 0.5 mg by mouth 3 (three) times daily as needed for anxiety.      lisinopril (ZESTRIL) 20 MG tablet Take 20 mg by mouth daily.     naproxen sodium (ALEVE) 220 MG tablet Take 220 mg by mouth daily as needed (headache).      oxyCODONE-acetaminophen (PERCOCET) 7.5-325 MG tablet Take 1 tablet by mouth every 6 (six) hours as needed for moderate pain or severe pain.       simvastatin (ZOCOR) 20 MG tablet Take 20 mg by mouth at bedtime.     albuterol (VENTOLIN HFA) 108 (90 Base) MCG/ACT inhaler Inhale 2 puffs into the lungs every 4 (four) hours as needed for wheezing or shortness of breath.      No current facility-administered medications for this visit.    Allergies as of 11/18/2022 - Review Complete 11/18/2022  Allergen Reaction Noted   Gabapentin Diarrhea 11/18/2022    Family History  Problem Relation Age of Onset   Colon cancer Mother 30   Stroke Father     Social History   Socioeconomic History   Marital status: Single    Spouse name: Not on file   Number of children: Not on file   Years of education: Not on file   Highest education level: Not on file  Occupational History   Not on file  Tobacco Use   Smoking status: Every Day    Current packs/day: 0.50    Types: Cigarettes   Smokeless tobacco: Never  Vaping Use   Vaping status: Never Used  Substance and Sexual Activity   Alcohol use: No   Drug use: No   Sexual activity: Not on file  Other Topics Concern   Not on file  Social History Narrative   Not on file   Social Determinants of Health   Financial Resource Strain: Not on file  Food Insecurity: Not on file  Transportation Needs: Not on file  Physical Activity: Not on file  Stress: Not on file  Social Connections: Not on file    Review of Systems: Gen: Denies fever, chills, cold or flulike symptoms, presyncope, syncope. CV: Denies chest pain, palpitations. Resp: Denies dyspnea, cough. GI: See HPI Derm: Denies rash. Psych: Denies depression, anxiety. Heme: See HPI  Physical Exam: BP 127/79 (BP Location: Right Arm, Patient Position: Sitting, Cuff Size: Normal)   Pulse (!) 101   Temp 97.7 F (36.5 C) (Temporal)   Ht 6' (1.829 m)   Wt 141 lb 9.6 oz (64.2 kg)   SpO2 96%   BMI 19.20 kg/m  General:   Alert and oriented. No distress noted. Pleasant and cooperative.  Head:  Normocephalic and  atraumatic. Eyes:  Conjuctiva clear without scleral icterus. Heart:  S1, S2 present without murmurs appreciated. Lungs:  Clear to auscultation bilaterally. No wheezes, rales, or rhonchi. No distress.  Abdomen:  +BS, soft, non-tender and non-distended. No rebound or guarding. No HSM or masses noted. Msk:  Symmetrical without gross deformities. Normal posture. Extremities:  Without edema. Neurologic:  Alert and  oriented x4 Psych:  Normal mood and affect.    Assessment:  72 year old male with history of basal cell carcinoma, anxiety, arthritis, hyperlipidemia, hypertension, hyperplastic colon polyps, presenting today for ER follow-up of colitis and rectal bleeding.  Colitis/rectal bleeding: Patient developed new onset LLQ abdominal pain, diarrhea, and toilet tissue hematochezia on 7/9.  He was  found to have moderate wall thickening of the descending and sigmoid colon consistent with colitis on CT.  Hemoglobin 15.3, but stool heme positive.  He was prescribed Augmentin x 5 days and all symptoms have resolved.  In fact, toilet tissue hematochezia and diarrhea resolved in less than 24 hours.  Query whether he had an episode of ischemic colitis.  He denies any trouble with lower abdominal pain, diarrhea, or rectal bleeding in the past.  His last colonoscopy was in 2021 with nonbleeding internal hemorrhoids, sigmoid and descending colon diverticulosis, 2 hyperplastic polyps removed.  He was originally recommended to have a repeat colonoscopy in 10 years; however, considering colitis, recommended updating a colonoscopy in about 4-6 weeks. Patient has requested to call us when he is ready to schedule as he is getting ready to undergo facial surgery for basal cell carcinoma.  Notably, mother with colon cancer at age 32.  Drug-induced constipation: Patient reports chronic constipation in setting of chronic opioid therapy for arthritis/back pain.  States PCP prescribed him something for constipation which  she picked up today, but cannot remember the name of the medication.  I have requested that he call us back to let us know what this medication is.   Plan:  Patient will call us when he is ready to schedule a colonoscopy. I requested that he let us know what medication his primary care doctor prescribed for constipation. Follow-up as needed.   Ermalinda Memos, PA-C [facility] 11/18/2022   Addendum: Patient reports he has been started on Linzess 72 mcg daily.   Ermalinda Memos, PA-C [facility]

## 2022-11-18 ENCOUNTER — Ambulatory Visit: Payer: 59 | Admitting: Gastroenterology

## 2022-11-18 ENCOUNTER — Encounter: Payer: Self-pay | Admitting: Gastroenterology

## 2022-11-18 VITALS — BP 127/79 | HR 101 | Temp 97.7°F | Ht 72.0 in | Wt 141.6 lb

## 2022-11-18 DIAGNOSIS — K625 Hemorrhage of anus and rectum: Secondary | ICD-10-CM

## 2022-11-18 DIAGNOSIS — Z8719 Personal history of other diseases of the digestive system: Secondary | ICD-10-CM | POA: Diagnosis not present

## 2022-11-18 DIAGNOSIS — K5903 Drug induced constipation: Secondary | ICD-10-CM

## 2022-11-18 NOTE — Patient Instructions (Addendum)
Let me know what your primary care doctor has prescribed for constipation.   Let me know when you are ready to schedule a colonoscopy.    Ermalinda Memos, PA-C [facility]

## 2022-12-22 ENCOUNTER — Ambulatory Visit: Payer: 59 | Admitting: Urology

## 2022-12-22 VITALS — BP 122/75 | HR 103

## 2022-12-22 DIAGNOSIS — N329 Bladder disorder, unspecified: Secondary | ICD-10-CM

## 2022-12-22 LAB — MICROSCOPIC EXAMINATION: Bacteria, UA: NONE SEEN

## 2022-12-22 LAB — URINALYSIS, ROUTINE W REFLEX MICROSCOPIC
Bilirubin, UA: NEGATIVE
Glucose, UA: NEGATIVE
Ketones, UA: NEGATIVE
Leukocytes,UA: NEGATIVE
Nitrite, UA: NEGATIVE
Protein,UA: NEGATIVE
Specific Gravity, UA: <1.005 — ABNORMAL LOW (ref 1.005–1.030)
Urobilinogen, Ur: 1 mg/dL (ref 0.2–1.0)
pH, UA: 6 (ref 5.0–7.5)

## 2022-12-22 NOTE — Progress Notes (Signed)
12/22/2022 9:47 AM   Jenelle Mages Feb 21, 1951 [id number]  Referring provider: Erasmo Downer, NP [address] [place],  [place] [address]  Bladder mass   HPI: Mr Shillingburg is a 72yo here for evaluation of a bladder mass. He was seen in the ER 7/9 for colitis and underwent CT which showed a possible posterior bladder wall lesion. No history of hematuria. He has a 50pk year smoking history. No gross hematuria   PMH: Past Medical History:  Diagnosis Date   Anxiety    Arthritis    Hyperlipidemia    Hypertension     Surgical History: Past Surgical History:  Procedure Laterality Date   COLONOSCOPY     COLONOSCOPY WITH PROPOFOL N/A 03/14/2020   Surgeon: Lanelle Bal, DO: Nonbleeding internal hemorrhoids, sigmoid and descending colon diverticulosis, two 2-3 mm polyps in the sigmoid colon resected and retrieved.  Pathology with hyperplastic polyp.  Recommended 10-year repeat.   POLYPECTOMY  03/14/2020   Procedure: POLYPECTOMY;  Surgeon: Lanelle Bal, DO;  Location: [facility];  Service: Endoscopy;;    Home Medications:  Allergies as of 12/22/2022       Reactions   Gabapentin Diarrhea        Medication List        Accurate as of December 22, 2022  9:47 AM. If you have any questions, ask your nurse or doctor.          albuterol 108 (90 Base) MCG/ACT inhaler Commonly known as: VENTOLIN HFA Inhale 2 puffs into the lungs every 4 (four) hours as needed for wheezing or shortness of breath.   buPROPion 300 MG 24 hr tablet Commonly known as: WELLBUTRIN XL Take 300 mg by mouth every morning.   clonazePAM 0.5 MG tablet Commonly known as: KLONOPIN Take 0.5 mg by mouth 3 (three) times daily as needed for anxiety.   lisinopril 20 MG tablet Commonly known as: ZESTRIL Take 20 mg by mouth daily.   naproxen sodium 220 MG tablet Commonly known as: ALEVE Take 220 mg by mouth daily as needed (headache).   oxyCODONE-acetaminophen 7.5-325 MG tablet Commonly  known as: PERCOCET Take 1 tablet by mouth every 6 (six) hours as needed for moderate pain or severe pain.   simvastatin 20 MG tablet Commonly known as: ZOCOR Take 20 mg by mouth at bedtime.   Vitamin D3 50 MCG (2000 UT) Tabs Take 2,000 Units by mouth daily.        Allergies:  Allergies  Allergen Reactions   Gabapentin Diarrhea    Family History: Family History  Problem Relation Age of Onset   Colon cancer Mother 37   Stroke Father     Social History:  reports that he has been smoking cigarettes. He has never used smokeless tobacco. He reports that he does not drink alcohol and does not use drugs.  ROS: All other review of systems were reviewed and are negative except what is noted above in HPI  Physical Exam: BP 122/75   Pulse (!) 103   Constitutional:  Alert and oriented, No acute distress. HEENT: [place] AT, moist mucus membranes.  Trachea midline, no masses. Cardiovascular: No clubbing, cyanosis, or edema. Respiratory: Normal respiratory effort, no increased work of breathing. GI: Abdomen is soft, nontender, nondistended, no abdominal masses GU: No CVA tenderness.  Lymph: No cervical or inguinal lymphadenopathy. Skin: No rashes, bruises or suspicious lesions. Neurologic: Grossly intact, no focal deficits, moving all 4 extremities. Psychiatric: Normal mood and affect.  Laboratory Data:  Lab Results  Component Value Date   WBC 22.3 (H) 11/09/2022   HGB 15.3 11/09/2022   HCT 45.8 11/09/2022   MCV 94.4 11/09/2022   PLT 220 11/09/2022    Lab Results  Component Value Date   CREATININE 1.10 11/09/2022    No results found for: "PSA"  No results found for: "TESTOSTERONE"  No results found for: "HGBA1C"  Urinalysis    Component Value Date/Time   COLORURINE YELLOW 11/09/2022 1410   APPEARANCEUR CLEAR 11/09/2022 1410   LABSPEC 1.009 11/09/2022 1410   PHURINE 6.0 11/09/2022 1410   GLUCOSEU NEGATIVE 11/09/2022 1410   HGBUR SMALL (A) 11/09/2022 1410    BILIRUBINUR NEGATIVE 11/09/2022 1410   KETONESUR NEGATIVE 11/09/2022 1410   PROTEINUR NEGATIVE 11/09/2022 1410   NITRITE NEGATIVE 11/09/2022 1410   LEUKOCYTESUR NEGATIVE 11/09/2022 1410    Lab Results  Component Value Date   BACTERIA NONE SEEN 11/09/2022    Pertinent Imaging:  No results found for this or any previous visit.  No results found for this or any previous visit.  No results found for this or any previous visit.  No results found for this or any previous visit.  No results found for this or any previous visit.  No valid procedures specified. No results found for this or any previous visit.  No results found for this or any previous visit.     Cystoscopy Procedure Note  Patient identification was confirmed, informed consent was obtained, and patient was prepped using Betadine solution.  Lidocaine jelly was administered per urethral meatus.     Pre-Procedure: - Inspection reveals a normal caliber ureteral meatus.  Procedure: The flexible cystoscope was introduced without difficulty - No urethral strictures/lesions are present. 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  Wilkie Aye, MD   Assessment & Plan:    1. Lesion of bladder The risks/benefits/alternatives to endoscopic bladder tumor resection was explained to the patient and he understands and wishes to proceed with surgery - Urinalysis, Routine w reflex microscopic   No follow-ups on file.  Wilkie Aye, MD  [facility] [place]

## 2022-12-28 ENCOUNTER — Encounter: Payer: Self-pay | Admitting: Urology

## 2022-12-28 MED ORDER — CIPROFLOXACIN HCL 500 MG PO TABS
500.0000 mg | ORAL_TABLET | Freq: Once | ORAL | Status: DC
Start: 2022-12-28 — End: 2023-05-25

## 2022-12-28 NOTE — Patient Instructions (Signed)
Transurethral Resection of Bladder Tumor  Transurethral resection of a bladder tumor is the removal (resection) of cancerous tissue (tumor) from the inside wall of the bladder. The bladder is the organ that holds urine. The tumor is removed through the tube that carries urine out of the body (urethra). In a transurethral resection, a thin telescope with a light, a tiny camera, and an electric cutting edge (resectoscope) is passed through the urethra. In men, the opening of the urethra is at the end of the penis. In women, it is just above the opening of the vagina. Tell a health care provider about: Any allergies you have. All medicines you are taking, including vitamins, herbs, eye drops, creams, and over-the-counter medicines. Any problems you or family members have had with anesthetic medicines. Any bleeding problems you have. Any surgeries you have had. Any medical conditions you have, including recent urinary tract infections. Whether you are pregnant or may be pregnant. What are the risks? Generally, this is a safe procedure. However, problems may occur, including: Infection. Bleeding. Allergic reactions to medicines. Damage to nearby structures or organs. Difficulty urinating from blockage of the urethra or not being able to urinate (urinary retention). Deep vein thrombosis. This is a blood clot that can develop in your leg. Recurring cancer. What happens before the procedure? When to stop eating and drinking Follow instructions from your health care provider about what you may eat and drink before your procedure. These may include: 8 hours before your procedure Stop eating most foods. Do not eat meat, fried foods, or fatty foods. Eat only light foods, such as toast or crackers. All liquids are okay except energy drinks and alcohol. 6 hours before your procedure Stop eating. Drink only clear liquids, such as water, clear fruit juice, black coffee, plain tea, and sports  drinks. Do not drink energy drinks or alcohol. 2 hours before your procedure Stop drinking all liquids. You may be allowed to take medicines with small sips of water. Medicines Ask your health care provider about: Changing or stopping your regular medicines. This is especially important if you are taking diabetes medicines or blood thinners. Taking medicines such as aspirin and ibuprofen. These medicines can thin your blood. Do not take these medicines unless your health care provider tells you to take them. Taking over-the-counter medicines, vitamins, herbs, and supplements. General instructions If you will be going home right after the procedure, plan to have a responsible adult: Take you home from the hospital or clinic. You will not be allowed to drive. Care for you for the time you are told. Ask your health care provider what steps will be taken to help prevent infection. These steps may include: Washing skin with a germ-killing soap. Taking antibiotic medicine. Do not use any products that contain nicotine or tobacco for at least 4 weeks before the procedure. These products include cigarettes, chewing tobacco, and vaping devices, such as e-cigarettes. If you need help quitting, ask your health care provider. What happens during the procedure? An IV will be inserted into one of your veins. You will be given one or more of the following: A medicine to help you relax (sedative). A medicine that is injected into your spine to numb the area below and slightly above the injection site (spinal anesthetic). A medicine that is injected into an area of your body to numb everything below the injection site (regional anesthetic). A medicine to make you fall asleep (general anesthetic). Your legs will be placed  in foot rests (stirrups) to open your legs and bend your knees. The resectoscope will be passed through your urethra and into your bladder. The part of your bladder with the tumor will be  resected by the cutting edge of the resectoscope. Fluid will be passed to rinse out the cut tissues (irrigation). The resectoscope will then be taken out. A small, thin tube (catheter) will be passed through your urethra and into your bladder. The catheter will drain urine into a bag outside of your body. The procedure may vary among health care providers and hospitals. What happens after the procedure? Your blood pressure, heart rate, breathing rate, and blood oxygen level will be monitored until you leave the hospital or clinic. You may continue to receive fluids and medicines through an IV. You will be given pain medicine to relieve pain. You will have a catheter to drain your urine. The amount of urine will be measured. If you have blood in your urine, your bladder may be rinsed out by passing fluid through your catheter. You will be encouraged to walk as soon as you can. You may have to wear compression stockings. These stockings help to prevent blood clots and reduce swelling in your legs. If you were given a sedative during the procedure, it can affect you for several hours. Do not drive or operate machinery until your health care provider says that it is safe. Summary Transurethral resection of a bladder tumor is the removal (resection) of a cancerous growth (tumor) on the inside wall of the bladder. To do this procedure, your health care provider uses a thin telescope with a light, a tiny camera, and an electric cutting edge (resectoscope) that is guided to your bladder through your urethra. The part of your bladder that is affected by the tumor will be resected by the cutting edge of the resectoscope. A catheter will be passed through your urethra and into your bladder. The catheter will drain urine into a bag outside of your body. If you will be going home right after the procedure, plan to have a responsible adult take you home from the hospital or clinic. You will not be allowed to  drive. This information is not intended to replace advice given to you by your health care provider. Make sure you discuss any questions you have with your health care provider. Document Revised: 04/24/2021 Document Reviewed: 04/24/2021 Elsevier Patient Education  2024 [employer].

## 2023-01-17 ENCOUNTER — Other Ambulatory Visit (HOSPITAL_COMMUNITY): Payer: Self-pay | Admitting: Nurse Practitioner

## 2023-01-17 DIAGNOSIS — Z720 Tobacco use: Secondary | ICD-10-CM

## 2023-01-26 ENCOUNTER — Ambulatory Visit (HOSPITAL_COMMUNITY)
Admission: RE | Admit: 2023-01-26 | Discharge: 2023-01-26 | Disposition: A | Discharge status: Home / Self Care | Payer: 59 | Source: Ambulatory Visit | Attending: Nurse Practitioner | Admitting: Nurse Practitioner

## 2023-01-26 DIAGNOSIS — Z720 Tobacco use: Secondary | ICD-10-CM

## 2023-01-26 DIAGNOSIS — F17210 Nicotine dependence, cigarettes, uncomplicated: Secondary | ICD-10-CM | POA: Insufficient documentation

## 2023-02-14 ENCOUNTER — Encounter: Payer: 59 | Admitting: Urology

## 2023-02-15 NOTE — Patient Instructions (Signed)
Rodney Stone  02/15/2023     @PREFPERIOPPHARMACY @   Your procedure is scheduled on  02/21/2023.   Report to [name] at  1205 P.M.   Call this number if you have problems the morning of surgery:  [phone]  If you experience any cold or flu symptoms such as cough, fever, chills, shortness of breath, etc. between now and your scheduled surgery, please notify us at the above number.   Remember:  Do not eat after midnight.    You may drink clear liquids until 1000 am on 02/21/2023.     Clear liquids allowed are:                    Water, Juice (No red color; non-citric and without pulp; diabetics please choose diet or no sugar options), Carbonated beverages (diabetics please choose diet or no sugar options), Clear Tea (No creamer, milk, or cream, including half & half and powdered creamer), Black Coffee Only (No creamer, milk or cream, including half & half and powdered creamer), and Clear Sports drink (No red color; diabetics please choose diet or no sugar options)        Use your inhaler before you come and bring your rescue inhaler with you.    Take these medicines the morning of surgery with A SIP OF WATER            bupropion, clonazepam, oxycodone(if needed).     Do not wear jewelry, make-up or nail polish, including gel polish,  artificial nails, or any other type of covering on natural nails (fingers and  toes).  Do not wear lotions, powders, or perfumes, or deodorant.  Do not shave 48 hours prior to surgery.  Men may shave face and neck.  Do not bring valuables to the hospital.  [facility] is not responsible for any belongings or valuables.  Contacts, dentures or bridgework may not be worn into surgery.  Leave your suitcase in the car.  After surgery it may be brought to your room.  For patients admitted to the hospital, discharge time will be determined by your treatment team.  Patients discharged the day of surgery will not be allowed to  drive home and must have someone with them for 24 hours.    Special instructions:   DO NOT smoke tobacco or vape for 24 hours before your procedure.  Please read over the following fact sheets that you were given. Coughing and Deep Breathing, Surgical Site Infection Prevention, Anesthesia Post-op Instructions, and Care and Recovery After Surgery      Transurethral Resection of Bladder Tumor, Care After The following information offers guidance on how to care for yourself after your procedure. Your health care provider may also give you more specific instructions. If you have problems or questions, contact your health care provider. What can I expect after the procedure? After the procedure, it is common to have: A small amount of blood or small blood clots in your urine for up to 2 weeks. Soreness or mild pain from your catheter. After your catheter is removed, you may have mild soreness, especially when urinating. A need to urinate often. Pain in your lower abdomen. Follow these instructions at home: Medicines  Take over-the-counter and prescription medicines only as told by your health care provider. If you were prescribed an antibiotic medicine, take it as told by your health care provider. Do not stop taking the antibiotic even if you  start to feel better. Ask your health care provider if the medicine prescribed to you: Requires you to avoid driving or using machinery. Can cause constipation. You may need to take these actions to prevent or treat constipation: Drink enough fluid to keep your urine pale yellow. Take over-the-counter or prescription medicines. Eat foods that are high in fiber, such as beans, whole grains, and fresh fruits and vegetables. Limit foods that are high in fat and processed sugars, such as fried or sweet foods. Activity  If you were given a sedative during the procedure, it can affect you for several hours. Do not drive or operate machinery until your  health care provider says that it is safe. Rest as told by your health care provider. Avoid sitting for a long time without moving. Get up to take short walks every 1-2 hours. This is important to improve blood flow and breathing. Ask for help if you feel weak or unsteady. Do not lift anything that is heavier than 10 lb (4.5 kg), or the limit that you are told, until your health care provider says that it is safe. Avoid intense physical activity for as long as told by your health care provider. Do not have sex until your health care provider approves. Return to your normal activities as told by your health care provider. Ask your health care provider what activities are safe for you. General instructions If you have a catheter, follow instructions from your health care provider about caring for your catheter and your drainage bag. Do not drink alcohol for as long as told by your health care provider. This is especially important if you are taking prescription pain medicines. Do not use any products that contain nicotine or tobacco. These products include cigarettes, chewing tobacco, and vaping devices, such as e-cigarettes. If you need help quitting, ask your health care provider. Wear compression stockings as told by your health care provider. These stockings help to prevent blood clots and reduce swelling in your legs. Keep all follow-up visits. This is important. You will need to be followed closely with regular checks of your bladder and urethra (cystoscopies) to make sure that the cancer does not come back. Contact a health care provider if: You have blood in your urine for more than 2 weeks. You become constipated. Signs of constipation may include: Having fewer than three bowel movements in a week. Difficulty having a bowel movement. Stools that are dry, hard, or larger than normal. You have a urinary catheter in place, and you have: Spasms or pain. Problems with your catheter or your  catheter is blocked. Your catheter has been taken out but you are unable to urinate. You have signs of infection, such as: Fever or chills. Cloudy or bad-smelling urine. Get help right away if: You have severe abdominal pain that gets worse or does not improve with medicine. You have a lot of large blood clots in your urine. You develop swelling or pain in your leg. You have difficulty breathing. These symptoms may be an emergency. Get help right away. Call 911. Do not wait to see if the symptoms will go away. Do not drive yourself to the hospital. Summary After your procedure, it is common to have a small amount of blood or small blood clots in your urine, soreness or mild pain from your catheter, and pain in your lower abdomen. Take over-the-counter and prescription medicines only as told by your health care provider. Rest as told by your health care provider. Follow  your health care provider's instructions about returning to normal activities. Ask what activities are safe for you. If you have a catheter, follow instructions from your health care provider about caring for your catheter and your drainage bag. This information is not intended to replace advice given to you by your health care provider. Make sure you discuss any questions you have with your health care provider. Document Revised: 04/24/2021 Document Reviewed: 04/24/2021 Elsevier Patient Education  2024 [employer] General Anesthesia, Adult, Care After The following information offers guidance on how to care for yourself after your procedure. Your health care provider may also give you more specific instructions. If you have problems or questions, contact your health care provider. What can I expect after the procedure? After the procedure, it is common for people to: Have pain or discomfort at the IV site. Have nausea or vomiting. Have a sore throat or hoarseness. Have trouble concentrating. Feel cold or chills. Feel  weak, sleepy, or tired (fatigue). Have soreness and body aches. These can affect parts of the body that were not involved in surgery. Follow these instructions at home: For the time period you were told by your health care provider:  Rest. Do not participate in activities where you could fall or become injured. Do not drive or use machinery. Do not drink alcohol. Do not take sleeping pills or medicines that cause drowsiness. Do not make important decisions or sign legal documents. Do not take care of children on your own. General instructions Drink enough fluid to keep your urine pale yellow. If you have sleep apnea, surgery and certain medicines can increase your risk for breathing problems. Follow instructions from your health care provider about wearing your sleep device: Anytime you are sleeping, including during daytime naps. While taking prescription pain medicines, sleeping medicines, or medicines that make you drowsy. Return to your normal activities as told by your health care provider. Ask your health care provider what activities are safe for you. Take over-the-counter and prescription medicines only as told by your health care provider. Do not use any products that contain nicotine or tobacco. These products include cigarettes, chewing tobacco, and vaping devices, such as e-cigarettes. These can delay incision healing after surgery. If you need help quitting, ask your health care provider. Contact a health care provider if: You have nausea or vomiting that does not get better with medicine. You vomit every time you eat or drink. You have pain that does not get better with medicine. You cannot urinate or have bloody urine. You develop a skin rash. You have a fever. Get help right away if: You have trouble breathing. You have chest pain. You vomit blood. These symptoms may be an emergency. Get help right away. Call 911. Do not wait to see if the symptoms will go away. Do  not drive yourself to the hospital. Summary After the procedure, it is common to have a sore throat, hoarseness, nausea, vomiting, or to feel weak, sleepy, or fatigue. For the time period you were told by your health care provider, do not drive or use machinery. Get help right away if you have difficulty breathing, have chest pain, or vomit blood. These symptoms may be an emergency. This information is not intended to replace advice given to you by your health care provider. Make sure you discuss any questions you have with your health care provider. Document Revised: 07/17/2021 Document Reviewed: 07/17/2021 Elsevier Patient Education  2024 [employer] Inc. How to Use Chlorhexidine Before Surgery Chlorhexidine  gluconate (CHG) is a germ-killing (antiseptic) solution that is used to clean the skin. It can get rid of the bacteria that normally live on the skin and can keep them away for about 24 hours. To clean your skin with CHG, you may be given: A CHG solution to use in the shower or as part of a sponge bath. A prepackaged cloth that contains CHG. Cleaning your skin with CHG may help lower the risk for infection: While you are staying in the intensive care unit of the hospital. If you have a vascular access, such as a central line, to provide short-term or long-term access to your veins. If you have a catheter to drain urine from your bladder. If you are on a ventilator. A ventilator is a machine that helps you breathe by moving air in and out of your lungs. After surgery. What are the risks? Risks of using CHG include: A skin reaction. Hearing loss, if CHG gets in your ears and you have a perforated eardrum. Eye injury, if CHG gets in your eyes and is not rinsed out. The CHG product catching fire. Make sure that you avoid smoking and flames after applying CHG to your skin. Do not use CHG: If you have a chlorhexidine allergy or have previously reacted to chlorhexidine. On babies younger than  74 months of age. How to use CHG solution Use CHG only as told by your health care provider, and follow the instructions on the label. Use the full amount of CHG as directed. Usually, this is one bottle. During a shower Follow these steps when using CHG solution during a shower (unless your health care provider gives you different instructions): Start the shower. Use your normal soap and shampoo to wash your face and hair. Turn off the shower or move out of the shower stream. Pour the CHG onto a clean washcloth. Do not use any type of brush or rough-edged sponge. Starting at your neck, lather your body down to your toes. Make sure you follow these instructions: If you will be having surgery, pay special attention to the part of your body where you will be having surgery. Scrub this area for at least 1 minute. Do not use CHG on your head or face. If the solution gets into your ears or eyes, rinse them well with water. Avoid your genital area. Avoid any areas of skin that have broken skin, cuts, or scrapes. Scrub your back and under your arms. Make sure to wash skin folds. Let the lather sit on your skin for 1-2 minutes or as long as told by your health care provider. Thoroughly rinse your entire body in the shower. Make sure that all body creases and crevices are rinsed well. Dry off with a clean towel. Do not put any substances on your body afterward--such as powder, lotion, or perfume--unless you are told to do so by your health care provider. Only use lotions that are recommended by the manufacturer. Put on clean clothes or pajamas. If it is the night before your surgery, sleep in clean sheets.  During a sponge bath Follow these steps when using CHG solution during a sponge bath (unless your health care provider gives you different instructions): Use your normal soap and shampoo to wash your face and hair. Pour the CHG onto a clean washcloth. Starting at your neck, lather your body down to  your toes. Make sure you follow these instructions: If you will be having surgery, pay special attention to the part  of your body where you will be having surgery. Scrub this area for at least 1 minute. Do not use CHG on your head or face. If the solution gets into your ears or eyes, rinse them well with water. Avoid your genital area. Avoid any areas of skin that have broken skin, cuts, or scrapes. Scrub your back and under your arms. Make sure to wash skin folds. Let the lather sit on your skin for 1-2 minutes or as long as told by your health care provider. Using a different clean, wet washcloth, thoroughly rinse your entire body. Make sure that all body creases and crevices are rinsed well. Dry off with a clean towel. Do not put any substances on your body afterward--such as powder, lotion, or perfume--unless you are told to do so by your health care provider. Only use lotions that are recommended by the manufacturer. Put on clean clothes or pajamas. If it is the night before your surgery, sleep in clean sheets. How to use CHG prepackaged cloths Only use CHG cloths as told by your health care provider, and follow the instructions on the label. Use the CHG cloth on clean, dry skin. Do not use the CHG cloth on your head or face unless your health care provider tells you to. When washing with the CHG cloth: Avoid your genital area. Avoid any areas of skin that have broken skin, cuts, or scrapes. Before surgery Follow these steps when using a CHG cloth to clean before surgery (unless your health care provider gives you different instructions): Using the CHG cloth, vigorously scrub the part of your body where you will be having surgery. Scrub using a back-and-forth motion for 3 minutes. The area on your body should be completely wet with CHG when you are done scrubbing. Do not rinse. Discard the cloth and let the area air-dry. Do not put any substances on the area afterward, such as powder,  lotion, or perfume. Put on clean clothes or pajamas. If it is the night before your surgery, sleep in clean sheets.  For general bathing Follow these steps when using CHG cloths for general bathing (unless your health care provider gives you different instructions). Use a separate CHG cloth for each area of your body. Make sure you wash between any folds of skin and between your fingers and toes. Wash your body in the following order, switching to a new cloth after each step: The front of your neck, shoulders, and chest. Both of your arms, under your arms, and your hands. Your stomach and groin area, avoiding the genitals. Your right leg and foot. Your left leg and foot. The back of your neck, your back, and your buttocks. Do not rinse. Discard the cloth and let the area air-dry. Do not put any substances on your body afterward--such as powder, lotion, or perfume--unless you are told to do so by your health care provider. Only use lotions that are recommended by the manufacturer. Put on clean clothes or pajamas. Contact a health care provider if: Your skin gets irritated after scrubbing. You have questions about using your solution or cloth. You swallow any chlorhexidine. Call your local poison control center (417-005-1707 in the [place] Get help right away if: Your eyes itch badly, or they become very red or swollen. Your skin itches badly and is red or swollen. Your hearing changes. You have trouble seeing. You have swelling or tingling in your mouth or throat. You have trouble breathing. These symptoms may represent a serious problem  that is an emergency. Do not wait to see if the symptoms will go away. Get medical help right away. Call your local emergency services (911 in the [place] Do not drive yourself to the hospital. Summary Chlorhexidine gluconate (CHG) is a germ-killing (antiseptic) solution that is used to clean the skin. Cleaning your skin with CHG may help to lower your  risk for infection. You may be given CHG to use for bathing. It may be in a bottle or in a prepackaged cloth to use on your skin. Carefully follow your health care provider's instructions and the instructions on the product label. Do not use CHG if you have a chlorhexidine allergy. Contact your health care provider if your skin gets irritated after scrubbing. This information is not intended to replace advice given to you by your health care provider. Make sure you discuss any questions you have with your health care provider. Document Revised: 08/17/2021 Document Reviewed: 06/30/2020 Elsevier Patient Education  2023 [employer].

## 2023-02-16 ENCOUNTER — Encounter (HOSPITAL_COMMUNITY): Payer: Self-pay

## 2023-02-16 ENCOUNTER — Encounter (HOSPITAL_COMMUNITY)
Admission: RE | Admit: 2023-02-16 | Discharge: 2023-02-16 | Disposition: A | Discharge status: Home / Self Care | Payer: 59 | Source: Ambulatory Visit | Attending: Urology | Admitting: Urology

## 2023-02-16 VITALS — BP 162/93 | HR 89 | Temp 98.8°F | Resp 16 | Ht 72.0 in | Wt 140.0 lb

## 2023-02-16 DIAGNOSIS — Z01818 Encounter for other preprocedural examination: Secondary | ICD-10-CM | POA: Insufficient documentation

## 2023-02-16 DIAGNOSIS — I10 Essential (primary) hypertension: Secondary | ICD-10-CM | POA: Diagnosis not present

## 2023-02-16 HISTORY — DX: Unspecified asthma, uncomplicated: J45.909

## 2023-02-16 HISTORY — DX: Chronic obstructive pulmonary disease, unspecified: J44.9

## 2023-02-16 LAB — CBC WITH DIFFERENTIAL/PLATELET
Abs Immature Granulocytes: 0.01 10*3/uL (ref 0.00–0.07)
Basophils Absolute: 0.1 10*3/uL (ref 0.0–0.1)
Basophils Relative: 1 %
Eosinophils Absolute: 0.2 10*3/uL (ref 0.0–0.5)
Eosinophils Relative: 3 %
HCT: 46.2 % (ref 39.0–52.0)
Hemoglobin: 15 g/dL (ref 13.0–17.0)
Immature Granulocytes: 0 %
Lymphocytes Relative: 44 %
Lymphs Abs: 2.8 10*3/uL (ref 0.7–4.0)
MCH: 31.8 pg (ref 26.0–34.0)
MCHC: 32.5 g/dL (ref 30.0–36.0)
MCV: 97.9 fL (ref 80.0–100.0)
Monocytes Absolute: 0.5 10*3/uL (ref 0.1–1.0)
Monocytes Relative: 8 %
Neutro Abs: 2.8 10*3/uL (ref 1.7–7.7)
Neutrophils Relative %: 44 %
Platelets: 269 10*3/uL (ref 150–400)
RBC: 4.72 MIL/uL (ref 4.22–5.81)
RDW: 12.1 % (ref 11.5–15.5)
WBC: 6.4 10*3/uL (ref 4.0–10.5)
nRBC: 0 % (ref 0.0–0.2)

## 2023-02-22 ENCOUNTER — Telehealth: Payer: Self-pay | Admitting: Urology

## 2023-02-22 NOTE — Telephone Encounter (Signed)
Patient cancelled his original surgery and rescheduled to 03/21/23 -he left vm he still has a preop appt scheduled for 10/28 does this need cancelled and rescheduled?

## 2023-02-22 NOTE — Telephone Encounter (Signed)
Appt rescheduled, left voicemail for patient to return call tomorrow to confirm surgery date.

## 2023-02-23 NOTE — Telephone Encounter (Signed)
Patient aware and confirmed new surgery date.

## 2023-02-23 NOTE — Telephone Encounter (Signed)
Patient returned missed call.  Please call at [phone].

## 2023-02-28 ENCOUNTER — Encounter: Payer: 59 | Admitting: Urology

## 2023-03-17 ENCOUNTER — Encounter (HOSPITAL_COMMUNITY): Payer: Self-pay

## 2023-03-17 ENCOUNTER — Encounter (HOSPITAL_COMMUNITY)
Admission: RE | Admit: 2023-03-17 | Discharge: 2023-03-17 | Disposition: A | Discharge status: Home / Self Care | Payer: 59 | Source: Ambulatory Visit | Attending: Urology | Admitting: Urology

## 2023-03-17 ENCOUNTER — Other Ambulatory Visit: Payer: Self-pay

## 2023-03-18 MED ORDER — GEMCITABINE CHEMO FOR BLADDER INSTILLATION 2000 MG
2000.0000 mg | Freq: Once | INTRAVENOUS | Status: AC
  Administered 2023-03-21: 2000 mg via INTRAVESICAL
  Filled 2023-03-18: qty 52.6

## 2023-03-21 ENCOUNTER — Encounter (HOSPITAL_COMMUNITY): Payer: Self-pay | Admitting: Urology

## 2023-03-21 ENCOUNTER — Ambulatory Visit (HOSPITAL_COMMUNITY)
Admission: RE | Admit: 2023-03-21 | Discharge: 2023-03-21 | Disposition: A | Discharge status: Home / Self Care | Payer: 59 | Attending: Urology | Admitting: Urology

## 2023-03-21 ENCOUNTER — Ambulatory Visit (HOSPITAL_BASED_OUTPATIENT_CLINIC_OR_DEPARTMENT_OTHER): Payer: 59 | Admitting: Certified Registered Nurse Anesthetist

## 2023-03-21 ENCOUNTER — Telehealth: Payer: Self-pay | Admitting: Urology

## 2023-03-21 ENCOUNTER — Encounter (HOSPITAL_COMMUNITY)
Admission: RE | Disposition: A | Discharge status: Home / Self Care | Payer: Self-pay | Source: Home / Self Care | Attending: Urology

## 2023-03-21 ENCOUNTER — Ambulatory Visit (HOSPITAL_COMMUNITY): Payer: 59 | Admitting: Certified Registered Nurse Anesthetist

## 2023-03-21 DIAGNOSIS — D494 Neoplasm of unspecified behavior of bladder: Secondary | ICD-10-CM

## 2023-03-21 DIAGNOSIS — C674 Malignant neoplasm of posterior wall of bladder: Secondary | ICD-10-CM | POA: Diagnosis not present

## 2023-03-21 DIAGNOSIS — I10 Essential (primary) hypertension: Secondary | ICD-10-CM | POA: Insufficient documentation

## 2023-03-21 DIAGNOSIS — R310 Gross hematuria: Secondary | ICD-10-CM | POA: Diagnosis not present

## 2023-03-21 DIAGNOSIS — F419 Anxiety disorder, unspecified: Secondary | ICD-10-CM | POA: Diagnosis not present

## 2023-03-21 DIAGNOSIS — K219 Gastro-esophageal reflux disease without esophagitis: Secondary | ICD-10-CM | POA: Diagnosis not present

## 2023-03-21 DIAGNOSIS — C679 Malignant neoplasm of bladder, unspecified: Secondary | ICD-10-CM | POA: Diagnosis present

## 2023-03-21 DIAGNOSIS — F17210 Nicotine dependence, cigarettes, uncomplicated: Secondary | ICD-10-CM

## 2023-03-21 DIAGNOSIS — J449 Chronic obstructive pulmonary disease, unspecified: Secondary | ICD-10-CM | POA: Diagnosis not present

## 2023-03-21 HISTORY — PX: TRANSURETHRAL RESECTION OF BLADDER TUMOR: SHX2575

## 2023-03-21 HISTORY — PX: CYSTOSCOPY: SHX5120

## 2023-03-21 HISTORY — PX: BLADDER INSTILLATION: SHX6893

## 2023-03-21 SURGERY — CYSTOSCOPY
Anesthesia: General | Site: Bladder

## 2023-03-21 MED ORDER — LACTATED RINGERS IV SOLN: INTRAVENOUS | Status: DC | PRN

## 2023-03-21 MED ORDER — LIDOCAINE HCL (PF) 2 % IJ SOLN
INTRAMUSCULAR | Status: DC | PRN
Start: 2023-03-21 — End: 2023-03-21
  Administered 2023-03-21: 60 mg via INTRADERMAL

## 2023-03-21 MED ORDER — LIDOCAINE HCL (PF) 2 % IJ SOLN
INTRAMUSCULAR | Status: AC
Start: 2023-03-21 — End: ?
  Filled 2023-03-21: qty 5

## 2023-03-21 MED ORDER — SUGAMMADEX SODIUM 200 MG/2ML IV SOLN
INTRAVENOUS | Status: DC | PRN
  Administered 2023-03-21: 254 mg via INTRAVENOUS

## 2023-03-21 MED ORDER — ORAL CARE MOUTH RINSE: 15.0000 mL | Freq: Once | OROMUCOSAL | Status: DC

## 2023-03-21 MED ORDER — PHENYLEPHRINE HCL-NACL 20-0.9 MG/250ML-% IV SOLN
INTRAVENOUS | Status: DC | PRN
  Administered 2023-03-21: 30 ug/min via INTRAVENOUS

## 2023-03-21 MED ORDER — CEFAZOLIN SODIUM-DEXTROSE 2-4 GM/100ML-% IV SOLN
INTRAVENOUS | Status: AC
  Filled 2023-03-21: qty 100

## 2023-03-21 MED ORDER — PHENYLEPHRINE 80 MCG/ML (10ML) SYRINGE FOR IV PUSH (FOR BLOOD PRESSURE SUPPORT)
PREFILLED_SYRINGE | INTRAVENOUS | Status: AC
  Filled 2023-03-21: qty 10

## 2023-03-21 MED ORDER — OXYCODONE-ACETAMINOPHEN 7.5-325 MG PO TABS: 1.0000 | ORAL_TABLET | Freq: Four times a day (QID) | ORAL | 0 refills | Status: DC | PRN

## 2023-03-21 MED ORDER — ONDANSETRON HCL 4 MG/2ML IJ SOLN
INTRAMUSCULAR | Status: DC | PRN
  Administered 2023-03-21: 4 mg via INTRAVENOUS

## 2023-03-21 MED ORDER — STERILE WATER FOR IRRIGATION IR SOLN
Status: DC | PRN
  Administered 2023-03-21: 500 mL

## 2023-03-21 MED ORDER — CEFAZOLIN SODIUM-DEXTROSE 2-4 GM/100ML-% IV SOLN
2.0000 g | INTRAVENOUS | Status: AC
  Administered 2023-03-21: 2 g via INTRAVENOUS

## 2023-03-21 MED ORDER — FENTANYL CITRATE (PF) 250 MCG/5ML IJ SOLN
INTRAMUSCULAR | Status: AC
  Filled 2023-03-21: qty 5

## 2023-03-21 MED ORDER — ROCURONIUM BROMIDE 10 MG/ML (PF) SYRINGE
PREFILLED_SYRINGE | INTRAVENOUS | Status: DC | PRN
  Administered 2023-03-21: 50 mg via INTRAVENOUS

## 2023-03-21 MED ORDER — ONDANSETRON HCL 4 MG/2ML IJ SOLN
INTRAMUSCULAR | Status: AC
Start: 2023-03-21 — End: ?
  Filled 2023-03-21: qty 2

## 2023-03-21 MED ORDER — SODIUM CHLORIDE 0.9 % IR SOLN
Status: DC | PRN
  Administered 2023-03-21: 3000 mL

## 2023-03-21 MED ORDER — PHENYLEPHRINE HCL-NACL 20-0.9 MG/250ML-% IV SOLN
INTRAVENOUS | Status: AC
  Filled 2023-03-21: qty 250

## 2023-03-21 MED ORDER — PHENYLEPHRINE 80 MCG/ML (10ML) SYRINGE FOR IV PUSH (FOR BLOOD PRESSURE SUPPORT)
PREFILLED_SYRINGE | INTRAVENOUS | Status: DC | PRN
  Administered 2023-03-21: 160 ug via INTRAVENOUS
  Administered 2023-03-21: 80 ug via INTRAVENOUS
  Administered 2023-03-21 (×2): 160 ug via INTRAVENOUS
  Administered 2023-03-21 (×3): 80 ug via INTRAVENOUS

## 2023-03-21 MED ORDER — PROPOFOL 10 MG/ML IV BOLUS
INTRAVENOUS | Status: DC | PRN
  Administered 2023-03-21: 120 mg via INTRAVENOUS

## 2023-03-21 MED ORDER — FENTANYL CITRATE (PF) 250 MCG/5ML IJ SOLN
INTRAMUSCULAR | Status: DC | PRN
  Administered 2023-03-21: 100 ug via INTRAVENOUS

## 2023-03-21 MED ORDER — LACTATED RINGERS IV SOLN: INTRAVENOUS | Status: DC

## 2023-03-21 MED ORDER — PROPOFOL 10 MG/ML IV BOLUS
INTRAVENOUS | Status: AC
Start: 2023-03-21 — End: ?
  Filled 2023-03-21: qty 20

## 2023-03-21 MED ORDER — CHLORHEXIDINE GLUCONATE 0.12 % MT SOLN
OROMUCOSAL | Status: AC
  Filled 2023-03-21: qty 15

## 2023-03-21 MED ORDER — ROCURONIUM BROMIDE 10 MG/ML (PF) SYRINGE
PREFILLED_SYRINGE | INTRAVENOUS | Status: AC
Start: 2023-03-21 — End: ?
  Filled 2023-03-21: qty 10

## 2023-03-21 MED ORDER — CHLORHEXIDINE GLUCONATE 0.12 % MT SOLN: 15.0000 mL | Freq: Once | OROMUCOSAL | Status: DC

## 2023-03-21 SURGICAL SUPPLY — 29 items
BAG DRAIN URO TABLE W/ADPT NS (BAG) ×1 IMPLANT
BAG HAMPER (MISCELLANEOUS) ×1 IMPLANT
BAG URINE DRAIN 2000ML AR STRL (UROLOGICAL SUPPLIES) ×1 IMPLANT
CATH FOLEY 2WAY SLVR 30CC 20FR (CATHETERS) IMPLANT
CATH FOLEY 2WAY SLVR 30CC 22FR (CATHETERS) IMPLANT
CATH FOLEY 3WAY 30CC 22F (CATHETERS) IMPLANT
CATH FOLEY 3WAY 30CC 22FR (CATHETERS) IMPLANT
CATH FOLEY LF 22FR (CATHETERS) IMPLANT
CLOTH BEACON ORANGE TIMEOUT ST (SAFETY) ×1 IMPLANT
ELECT LOOP 22F BIPOLAR SML (ELECTROSURGICAL) ×1
ELECTRODE LOOP 22F BIPOLAR SML (ELECTROSURGICAL) ×1 IMPLANT
GLOVE BIO SURGEON STRL SZ8 (GLOVE) ×1 IMPLANT
GLOVE BIOGEL PI IND STRL 7.0 (GLOVE) ×2 IMPLANT
GLOVE BIOGEL PI IND STRL 8 (GLOVE) IMPLANT
GLOVE SURG SS PI 8.0 STRL IVOR (GLOVE) IMPLANT
GOWN STRL REUS W/TWL XL LVL3 (GOWN DISPOSABLE) ×1 IMPLANT
IV NS IRRIG 3000ML ARTHROMATIC (IV SOLUTION) ×2 IMPLANT
KIT CHEMO SPILL (MISCELLANEOUS) ×1 IMPLANT
KIT TURNOVER CYSTO (KITS) ×1 IMPLANT
MANIFOLD NEPTUNE II (INSTRUMENTS) ×1 IMPLANT
PACK CYSTO (CUSTOM PROCEDURE TRAY) ×1 IMPLANT
PAD ARMBOARD 7.5X6 YLW CONV (MISCELLANEOUS) ×1 IMPLANT
PLUG CATH AND CAP STER (CATHETERS) IMPLANT
POSITIONER HEAD 8X9X4 ADT (SOFTGOODS) ×1 IMPLANT
SYR 30ML LL (SYRINGE) ×1 IMPLANT
SYR TOOMEY IRRIG 70ML (MISCELLANEOUS) ×1
SYRINGE TOOMEY IRRIG 70ML (MISCELLANEOUS) ×1 IMPLANT
TOWEL OR 17X26 4PK STRL BLUE (TOWEL DISPOSABLE) ×1 IMPLANT
WATER STERILE IRR 500ML POUR (IV SOLUTION) ×1 IMPLANT

## 2023-03-21 NOTE — Anesthesia Postprocedure Evaluation (Signed)
Anesthesia Post Note  Patient: Jenelle Mages  Procedure(s) Performed: CYSTOSCOPY (Bladder) TRANSURETHRAL RESECTION OF BLADDER TUMOR (TURBT) (Bladder) BLADDER INSTILLATION-gemcitabine (Bladder)  Patient location during evaluation: Phase II Anesthesia Type: General Level of consciousness: awake Pain management: pain level controlled Vital Signs Assessment: post-procedure vital signs reviewed and stable Respiratory status: spontaneous breathing and respiratory function stable Cardiovascular status: blood pressure returned to baseline and stable Postop Assessment: no headache and no apparent nausea or vomiting Anesthetic complications: no Comments: Late entry   No notable events documented.   Last Vitals:  Vitals:   03/21/23 1230 03/21/23 1246  BP: (!) 164/95 (!) 153/93  Pulse: 93 (!) 105  Resp: 18 15  Temp:  36.7 C  SpO2: 92% 95%    Last Pain:  Vitals:   03/21/23 1246  TempSrc: Oral  PainSc: 5                  Windell Norfolk

## 2023-03-21 NOTE — Anesthesia Preprocedure Evaluation (Signed)
Anesthesia Evaluation  Patient identified by MRN, date of birth, ID band Patient awake    Reviewed: Allergy & Precautions, H&P , NPO status , Patient's Chart, lab work & pertinent test results, reviewed documented beta blocker date and time   Airway Mallampati: II  TM Distance: >3 FB Neck ROM: full    Dental no notable dental hx.    Pulmonary neg pulmonary ROS, asthma , COPD, Current Smoker and Patient abstained from smoking.   Pulmonary exam normal breath sounds clear to auscultation       Cardiovascular Exercise Tolerance: Good hypertension, negative cardio ROS  Rhythm:regular Rate:Normal     Neuro/Psych   Anxiety     negative neurological ROS  negative psych ROS   GI/Hepatic negative GI ROS, Neg liver ROS,GERD  ,,  Endo/Other  negative endocrine ROS    Renal/GU negative Renal ROS  negative genitourinary   Musculoskeletal   Abdominal   Peds  Hematology negative hematology ROS (+)   Anesthesia Other Findings   Reproductive/Obstetrics negative OB ROS                             Anesthesia Physical Anesthesia Plan  ASA: 3  Anesthesia Plan: General and General LMA   Post-op Pain Management:    Induction:   PONV Risk Score and Plan: Ondansetron  Airway Management Planned:   Additional Equipment:   Intra-op Plan:   Post-operative Plan:   Informed Consent: I have reviewed the patients History and Physical, chart, labs and discussed the procedure including the risks, benefits and alternatives for the proposed anesthesia with the patient or authorized representative who has indicated his/her understanding and acceptance.     Dental Advisory Given  Plan Discussed with: CRNA  Anesthesia Plan Comments:        Anesthesia Quick Evaluation

## 2023-03-21 NOTE — Op Note (Signed)
.  Preoperative diagnosis: bladder tumor  Postoperative diagnosis: Same  Procedure: 1 cystoscopy 2. Transurethral resection of bladder tumor, medium 3. 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  Ureteral orifices in normal anatomic location.  Indications: Patient is a 72 year old male with a history of bladder tumor and gross hematuria.  After discussing treatment options, they decided proceed with transurethral resection of a bladder tumor.  Procedure in detail: The patient was brought to the operating room and a brief timeout was done to ensure correct patient, correct procedure, correct site.  General anesthesia was administered patient was placed in dorsal lithotomy position.  Their genitalia was then prepped and draped in usual sterile fashion.  A rigid 22 French cystoscope was passed in the urethra and the bladder.  Bladder was inspected and we noted a 3cm bladder tumor.  the ureteral orifices were in the normal orthotopic locations.  Using the bipolar resectoscope we removed the bladder tumor down to the base. Hemostasis was then obtained with electrocautery. We then removed the bladder tumor chips and sent them for pathology. We then re-inspected the bladder and found no residula bleeding.  the bladder was then drained, a 22 French foley was placed. 2g of gemcitabine was then instilled into the bladder, the foley clamped, and this concluded the procedure which was well tolerated by patient.  Complications: None  Condition: Stable, extubated, transferred to PACU  Plan: Patient is to have his foley drained in 1 hour and then be discharged home and followup in 7 days for foley catheter removal and pathology discussion.

## 2023-03-21 NOTE — H&P (Signed)
HPI: [name] is a 72yo here for bladder tumor resection for a bladder tumor foun don office cystoscopy. He was seen in the ER 7/9 for colitis and underwent CT which showed a possible posterior bladder wall lesion. No history of hematuria. He has a 50pk year smoking history. No gross hematuria     PMH:     Past Medical History:  Diagnosis Date   Anxiety     Arthritis     Hyperlipidemia     Hypertension            Surgical History:      Past Surgical History:  Procedure Laterality Date   COLONOSCOPY       COLONOSCOPY WITH PROPOFOL N/A 03/14/2020    Surgeon: Lanelle Bal, DO: Nonbleeding internal hemorrhoids, sigmoid and descending colon diverticulosis, two 2-3 mm polyps in the sigmoid colon resected and retrieved.  Pathology with hyperplastic polyp.  Recommended 10-year repeat.   POLYPECTOMY   03/14/2020    Procedure: POLYPECTOMY;  Surgeon: Lanelle Bal, DO;  Location: [facility];  Service: Endoscopy;;          Home Medications:  Allergies as of 12/22/2022         Reactions    Gabapentin Diarrhea            Medication List           Accurate as of December 22, 2022  9:47 AM. If you have any questions, ask your nurse or doctor.              albuterol 108 (90 Base) MCG/ACT inhaler Commonly known as: VENTOLIN HFA Inhale 2 puffs into the lungs every 4 (four) hours as needed for wheezing or shortness of breath.    buPROPion 300 MG 24 hr tablet Commonly known as: WELLBUTRIN XL Take 300 mg by mouth every morning.    clonazePAM 0.5 MG tablet Commonly known as: KLONOPIN Take 0.5 mg by mouth 3 (three) times daily as needed for anxiety.    lisinopril 20 MG tablet Commonly known as: ZESTRIL Take 20 mg by mouth daily.    naproxen sodium 220 MG tablet Commonly known as: ALEVE Take 220 mg by mouth daily as needed (headache).    oxyCODONE-acetaminophen 7.5-325 MG tablet Commonly known as: PERCOCET Take 1 tablet by mouth every 6 (six) hours as needed for  moderate pain or severe pain.    simvastatin 20 MG tablet Commonly known as: ZOCOR Take 20 mg by mouth at bedtime.    Vitamin D3 50 MCG (2000 UT) Tabs Take 2,000 Units by mouth daily.             Allergies:  Allergies      Allergies  Allergen Reactions   Gabapentin Diarrhea        Family History:      Family History  Problem Relation Age of Onset   Colon cancer Mother 57   Stroke Father            Social History:  reports that he has been smoking cigarettes. He has never used smokeless tobacco. He reports that he does not drink alcohol and does not use drugs.   ROS: All other review of systems were reviewed and are negative except what is noted above in HPI   Physical Exam: BP 122/75   Pulse (!) 103   Constitutional:  Alert and oriented, No acute distress. HEENT: [place] AT, moist mucus membranes.  Trachea midline, no masses. Cardiovascular: No  clubbing, cyanosis, or edema. Respiratory: Normal respiratory effort, no increased work of breathing. GI: Abdomen is soft, nontender, nondistended, no abdominal masses GU: No CVA tenderness.  Lymph: No cervical or inguinal lymphadenopathy. Skin: No rashes, bruises or suspicious lesions. Neurologic: Grossly intact, no focal deficits, moving all 4 extremities. Psychiatric: Normal mood and affect.   Laboratory Data: Recent Labs       Lab Results  Component Value Date    WBC 22.3 (H) 11/09/2022    HGB 15.3 11/09/2022    HCT 45.8 11/09/2022    MCV 94.4 11/09/2022    PLT 220 11/09/2022        Recent Labs       Lab Results  Component Value Date    CREATININE 1.10 11/09/2022        Recent Labs  No results found for: "PSA"     Recent Labs  No results found for: "TESTOSTERONE"     Recent Labs  No results found for: "HGBA1C"     Urinalysis Labs (Brief)          Component Value Date/Time    COLORURINE YELLOW 11/09/2022 1410    APPEARANCEUR CLEAR 11/09/2022 1410    LABSPEC 1.009 11/09/2022 1410     PHURINE 6.0 11/09/2022 1410    GLUCOSEU NEGATIVE 11/09/2022 1410    HGBUR SMALL (A) 11/09/2022 1410    BILIRUBINUR NEGATIVE 11/09/2022 1410    KETONESUR NEGATIVE 11/09/2022 1410    PROTEINUR NEGATIVE 11/09/2022 1410    NITRITE NEGATIVE 11/09/2022 1410    LEUKOCYTESUR NEGATIVE 11/09/2022 1410        Recent Labs       Lab Results  Component Value Date    BACTERIA NONE SEEN 11/09/2022        Pertinent Imaging:   No results found for this or any previous visit.   No results found for this or any previous visit.   No results found for this or any previous visit.   No results found for this or any previous visit.   No results found for this or any previous visit.   No valid procedures specified. No results found for this or any previous visit.   No results found for this or any previous visit.         Assessment & Plan:     1. Lesion of bladder The risks/benefits/alternatives to endoscopic bladder tumor resection was explained to the patient and he understands and wishes to proceed with surgery

## 2023-03-21 NOTE — Transfer of Care (Signed)
Immediate Anesthesia Transfer of Care Note  Patient: Rodney Stone  Procedure(s) Performed: CYSTOSCOPY (Bladder) TRANSURETHRAL RESECTION OF BLADDER TUMOR (TURBT) (Bladder) BLADDER INSTILLATION-gemcitabine (Bladder)  Patient Location: PACU  Anesthesia Type:General  Level of Consciousness: drowsy, patient cooperative, and responds to stimulation  Airway & Oxygen Therapy: Patient Spontanous Breathing and Patient connected to nasal cannula oxygen  Post-op Assessment: Report given to RN, Post -op Vital signs reviewed and stable, Patient moving all extremities X 4, and Patient able to stick tongue midline  Post vital signs: Reviewed and stable  Last Vitals:  Vitals Value Taken Time  BP 122/71 03/21/23 1112  Temp 36.3 C 03/21/23 1112  Pulse 79 03/21/23 1113  Resp 23 03/21/23 1113  SpO2 100 % 03/21/23 1113  Vitals shown include unfiled device data.  Last Pain:  Vitals:   03/21/23 1112  PainSc: Asleep         Complications: No notable events documented.

## 2023-03-21 NOTE — Telephone Encounter (Signed)
Patient called and said that he was to pick up an antibiotic after his surgery today? Please advise?

## 2023-03-21 NOTE — Progress Notes (Signed)
Gemcitabine emptied from bladder at this time. Total output . Chemo precautions followed. Patient tolerated procedure well. Foley connected to standard drainage bag and secured to leg using adhesive attachment.

## 2023-03-21 NOTE — Discharge Instructions (Signed)
Patient is to have his foley drained in 1 hour and then be discharged home and followup in 7 days for foley catheter removal and pathology discussion.

## 2023-03-21 NOTE — Anesthesia Procedure Notes (Signed)

## 2023-03-22 ENCOUNTER — Telehealth: Payer: Self-pay | Admitting: Urology

## 2023-03-22 LAB — SURGICAL PATHOLOGY

## 2023-03-22 NOTE — Telephone Encounter (Signed)
Rodney Stone had surgery yesterday and called because he is concerned that he is bleeding around the tube that goes in his penis. Please call him at [phone]

## 2023-03-22 NOTE — Telephone Encounter (Signed)
Patient stated catheter was patent and intact. He states he had some blood  around the head of his penis. Patient advised to try keep foley catheter from pulling and causing more pain/irritation by keeping It in the anchor and emptying the bag often. Patient advised to call office if he is unable to pass urine or if bleeding increase. Patient voiced understanding.

## 2023-03-22 NOTE — Telephone Encounter (Signed)
No abx needed per MD-pt notified

## 2023-03-24 ENCOUNTER — Telehealth: Payer: Self-pay | Admitting: Urology

## 2023-03-24 ENCOUNTER — Encounter (HOSPITAL_COMMUNITY): Payer: Self-pay | Admitting: Urology

## 2023-03-24 NOTE — Telephone Encounter (Signed)
Catheter anchor is coming loose. Can his sister pick up a couple today?

## 2023-03-24 NOTE — Telephone Encounter (Signed)
We will leave 1 up front for them to pick up.

## 2023-03-25 ENCOUNTER — Ambulatory Visit: Payer: 59

## 2023-03-25 ENCOUNTER — Telehealth: Payer: Self-pay | Admitting: Urology

## 2023-03-25 DIAGNOSIS — T83028A Displacement of other indwelling urethral catheter, initial encounter: Secondary | ICD-10-CM | POA: Diagnosis not present

## 2023-03-25 DIAGNOSIS — N329 Bladder disorder, unspecified: Secondary | ICD-10-CM

## 2023-03-25 NOTE — Telephone Encounter (Signed)
In a lot of pain. Had surgery and was up all night hurting. He wants to see if he can come in today. States he cannot go through another night of pain like last night

## 2023-03-25 NOTE — Progress Notes (Cosign Needed Addendum)
Patient presents today with complaints of pain from his catheter.  Upon evaluation, patient's catheter anchor was placed too far down on his leg causing the catheter to pull and cause irritation.  Anchor was reapplied to appropriate position and patient was educated on how to properly place catheter anchor and adjust if needed.  Patient also provided with an extra anchor.  He will return on Monday 11/25 for VT cath removal.  Patient will follow up as scheduled.    Guss Bunde, CMA

## 2023-03-25 NOTE — Telephone Encounter (Signed)
Offer him 12pm today for NV

## 2023-03-28 ENCOUNTER — Ambulatory Visit: Payer: 59 | Admitting: Urology

## 2023-03-28 VITALS — BP 171/95 | HR 112

## 2023-03-28 DIAGNOSIS — C674 Malignant neoplasm of posterior wall of bladder: Secondary | ICD-10-CM

## 2023-03-28 DIAGNOSIS — N329 Bladder disorder, unspecified: Secondary | ICD-10-CM

## 2023-03-28 MED ORDER — CIPROFLOXACIN HCL 500 MG PO TABS
500.0000 mg | ORAL_TABLET | Freq: Once | ORAL | Status: AC
  Administered 2023-03-28: 500 mg via ORAL

## 2023-03-28 NOTE — Progress Notes (Unsigned)
03/28/2023 9:25 AM   Rodney Stone Jan 06, 1951 [id number]  Referring provider: Erasmo Downer, NP [address] [place],  [place] [address]  No chief complaint on file.   HPI: Pathology [id number]. Voiding trial passed today***   PMH: Past Medical History:  Diagnosis Date   Anxiety    Arthritis    Asthma    COPD (chronic obstructive pulmonary disease) (HCC)    Hyperlipidemia    Hypertension     Surgical History: Past Surgical History:  Procedure Laterality Date   BLADDER INSTILLATION N/A 03/21/2023   Procedure: BLADDER INSTILLATION-gemcitabine;  Surgeon: Malen Gauze, MD;  Location: [facility];  Service: Urology;  Laterality: N/A;   COLONOSCOPY     COLONOSCOPY WITH PROPOFOL N/A 03/14/2020   Surgeon: Lanelle Bal, DO: Nonbleeding internal hemorrhoids, sigmoid and descending colon diverticulosis, two 2-3 mm polyps in the sigmoid colon resected and retrieved.  Pathology with hyperplastic polyp.  Recommended 10-year repeat.   CYSTOSCOPY N/A 03/21/2023   Procedure: CYSTOSCOPY;  Surgeon: Malen Gauze, MD;  Location: [facility];  Service: Urology;  Laterality: N/A;  LM to see if pt can move up   POLYPECTOMY  03/14/2020   Procedure: POLYPECTOMY;  Surgeon: Lanelle Bal, DO;  Location: [facility];  Service: Endoscopy;;   TRANSURETHRAL RESECTION OF BLADDER TUMOR N/A 03/21/2023   Procedure: TRANSURETHRAL RESECTION OF BLADDER TUMOR (TURBT);  Surgeon: Malen Gauze, MD;  Location: [facility];  Service: Urology;  Laterality: N/A;    Home Medications:  Allergies as of 03/28/2023   No Known Allergies      Medication List        Accurate as of March 28, 2023  9:25 AM. If you have any questions, ask your nurse or doctor.          albuterol 108 (90 Base) MCG/ACT inhaler Commonly known as: VENTOLIN HFA Inhale 2 puffs into the lungs every 4 (four) hours as needed (Asthma/COPD).   Breztri Aerosphere 160-9-4.8 MCG/ACT Aero Generic drug:  Budeson-Glycopyrrol-Formoterol Inhale 2 puffs into the lungs 2 (two) times daily. Rinse mouth after use   buPROPion 300 MG 24 hr tablet Commonly known as: WELLBUTRIN XL Take 300 mg by mouth every morning.   clonazePAM 0.5 MG tablet Commonly known as: KLONOPIN Take 0.5 mg by mouth 3 (three) times daily as needed for anxiety (Nerves).   Linzess 72 MCG capsule Generic drug: linaclotide Take 72 mcg by mouth daily as needed (Constipation).   lisinopril 20 MG tablet Commonly known as: ZESTRIL Take 20 mg by mouth daily.   naproxen sodium 220 MG tablet Commonly known as: ALEVE Take 220 mg by mouth daily as needed (headache).   oxyCODONE-acetaminophen 7.5-325 MG tablet Commonly known as: PERCOCET Take 1 tablet by mouth every 6 (six) hours as needed for moderate pain (pain score 4-6) or severe pain (pain score 7-10).   simvastatin 20 MG tablet Commonly known as: ZOCOR Take 20 mg by mouth at bedtime.   Vitamin D3 50 MCG (2000 UT) Tabs Take 2,000 Units by mouth daily.        Allergies: No Known Allergies  Family History: Family History  Problem Relation Age of Onset   Colon cancer Mother 37   Stroke Father     Social History:  reports that he has been smoking cigarettes. He has never used smokeless tobacco. He reports that he does not drink alcohol and does not use drugs.  ROS: All other review of systems were reviewed and are  negative except what is noted above in HPI  Physical Exam: BP (!) 171/95   Pulse (!) 112   Constitutional:  Alert and oriented, No acute distress. HEENT: [place] AT, moist mucus membranes.  Trachea midline, no masses. Cardiovascular: No clubbing, cyanosis, or edema. Respiratory: Normal respiratory effort, no increased work of breathing. GI: Abdomen is soft, nontender, nondistended, no abdominal masses GU: No CVA tenderness.  Lymph: No cervical or inguinal lymphadenopathy. Skin: No rashes, bruises or suspicious lesions. Neurologic: Grossly intact,  no focal deficits, moving all 4 extremities. Psychiatric: Normal mood and affect.  Laboratory Data: Lab Results  Component Value Date   WBC 6.4 02/16/2023   HGB 15.0 02/16/2023   HCT 46.2 02/16/2023   MCV 97.9 02/16/2023   PLT 269 02/16/2023    Lab Results  Component Value Date   CREATININE 1.10 11/09/2022    No results found for: "PSA"  No results found for: "TESTOSTERONE"  No results found for: "HGBA1C"  Urinalysis    Component Value Date/Time   COLORURINE YELLOW 11/09/2022 1410   APPEARANCEUR Clear 12/22/2022 0937   LABSPEC 1.009 11/09/2022 1410   PHURINE 6.0 11/09/2022 1410   GLUCOSEU Negative 12/22/2022 0937   HGBUR SMALL (A) 11/09/2022 1410   BILIRUBINUR Negative 12/22/2022 0937   KETONESUR NEGATIVE 11/09/2022 1410   PROTEINUR Negative 12/22/2022 0937   PROTEINUR NEGATIVE 11/09/2022 1410   NITRITE Negative 12/22/2022 0937   NITRITE NEGATIVE 11/09/2022 1410   LEUKOCYTESUR Negative 12/22/2022 0937   LEUKOCYTESUR NEGATIVE 11/09/2022 1410    Lab Results  Component Value Date   LABMICR See below: 12/22/2022   WBCUA 0-5 12/22/2022   LABEPIT 0-10 12/22/2022   BACTERIA None seen 12/22/2022    Pertinent Imaging:  No results found for this or any previous visit.  No results found for this or any previous visit.  No results found for this or any previous visit.  No results found for this or any previous visit.  No results found for this or any previous visit.  No valid procedures specified. No results found for this or any previous visit.  No results found for this or any previous visit.   Assessment & Plan:    1.  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  Wilkie Aye, MD  [facility] [place]

## 2023-03-28 NOTE — Progress Notes (Signed)
Fill and Pull Catheter Removal  Patient is present today for a catheter removal.  Patient was cleaned and prepped in a sterile fashion of sterile water/ saline was instilled into the bladder when the patient felt the urge to urinate. 30ml of water was then drained from the balloon.  A 22FR foley cath was removed from the bladder no complications were noted .  Patient as then given some time to void on their own.  Patient can void  on their own after some time.  Patient tolerated well.  Performed by: Gwendolyn Grant CMA & Guss Bunde CMA  Follow up/ Additional notes: MD to see after

## 2023-03-29 ENCOUNTER — Encounter: Payer: Self-pay | Admitting: Urology

## 2023-03-29 NOTE — Patient Instructions (Signed)

## 2023-05-13 NOTE — Progress Notes (Deleted)
 Patient cancelled

## 2023-05-19 ENCOUNTER — Ambulatory Visit: Payer: 59

## 2023-05-19 DIAGNOSIS — C674 Malignant neoplasm of posterior wall of bladder: Secondary | ICD-10-CM

## 2023-05-19 LAB — URINALYSIS, ROUTINE W REFLEX MICROSCOPIC
Bilirubin, UA: NEGATIVE
Glucose, UA: NEGATIVE
Ketones, UA: NEGATIVE
Leukocytes,UA: NEGATIVE
Nitrite, UA: NEGATIVE
Protein,UA: NEGATIVE
RBC, UA: NEGATIVE
Specific Gravity, UA: 1.01 (ref 1.005–1.030)
Urobilinogen, Ur: 0.2 mg/dL (ref 0.2–1.0)
pH, UA: 6.5 (ref 5.0–7.5)

## 2023-05-19 MED ORDER — BCG LIVE 50 MG IS SUSR
3.2400 mL | Freq: Once | INTRAVESICAL | Status: AC
  Administered 2023-05-19: 81 mg via INTRAVESICAL

## 2023-05-19 NOTE — Patient Instructions (Signed)

## 2023-05-19 NOTE — Progress Notes (Signed)
BCG Bladder Instillation  BCG # 1 of 6  Due to Bladder Cancer patient is present today for a BCG treatment. Patient was cleaned and prepped in a sterile fashion with betadine. A 14FR catheter was inserted, urine return was noted 30ml, urine was yellow in color.  50ml of reconstituted BCG was instilled into the bladder. The catheter was then removed. Patient tolerated well, no complications were noted  Performed by: Shariff Lasky LPN  Follow up/ Additional notes: keep scheduled NV

## 2023-05-25 ENCOUNTER — Other Ambulatory Visit: Payer: Self-pay

## 2023-05-25 ENCOUNTER — Ambulatory Visit: Payer: 59

## 2023-05-25 DIAGNOSIS — C674 Malignant neoplasm of posterior wall of bladder: Secondary | ICD-10-CM | POA: Diagnosis not present

## 2023-05-25 MED ORDER — BCG LIVE 50 MG IS SUSR
3.2400 mL | Freq: Once | INTRAVESICAL | Status: AC
  Administered 2023-05-25: 81 mg via INTRAVESICAL

## 2023-05-25 NOTE — Progress Notes (Signed)
Opened in error

## 2023-05-25 NOTE — Progress Notes (Signed)
BCG Bladder Instillation  BCG # 2  Due to Bladder Cancer patient is present today for a BCG treatment. Patient was cleaned and prepped in a sterile fashion with betadine. A 14FR catheter was inserted, urine return was noted 30ml, urine was yellow in color.  50ml of reconstituted BCG was instilled into the bladder. The catheter was then removed. Patient tolerated well, no complications were noted  Performed by: Alfonse Spruce. CMA  Follow up/ Additional notes: As scheduled

## 2023-05-25 NOTE — Progress Notes (Deleted)
Appt cancelled

## 2023-05-26 LAB — URINALYSIS, ROUTINE W REFLEX MICROSCOPIC
Bilirubin, UA: NEGATIVE
Glucose, UA: NEGATIVE
Ketones, UA: NEGATIVE
Leukocytes,UA: NEGATIVE
Nitrite, UA: NEGATIVE
Protein,UA: NEGATIVE
RBC, UA: NEGATIVE
Specific Gravity, UA: 1.02 (ref 1.005–1.030)
Urobilinogen, Ur: 1 mg/dL (ref 0.2–1.0)
pH, UA: 6 (ref 5.0–7.5)

## 2023-06-02 ENCOUNTER — Ambulatory Visit (INDEPENDENT_AMBULATORY_CARE_PROVIDER_SITE_OTHER): Payer: 59

## 2023-06-02 DIAGNOSIS — C674 Malignant neoplasm of posterior wall of bladder: Secondary | ICD-10-CM

## 2023-06-02 LAB — URINALYSIS, ROUTINE W REFLEX MICROSCOPIC
Bilirubin, UA: NEGATIVE
Glucose, UA: NEGATIVE
Ketones, UA: NEGATIVE
Leukocytes,UA: NEGATIVE
Nitrite, UA: NEGATIVE
Protein,UA: NEGATIVE
Specific Gravity, UA: <1.005 — ABNORMAL LOW (ref 1.005–1.030)
Urobilinogen, Ur: 0.2 mg/dL (ref 0.2–1.0)
pH, UA: 6.5 (ref 5.0–7.5)

## 2023-06-02 MED ORDER — BCG LIVE 50 MG IS SUSR
3.2400 mL | Freq: Once | INTRAVESICAL | Status: AC
  Administered 2023-06-02: 81 mg via INTRAVESICAL

## 2023-06-02 NOTE — Progress Notes (Signed)
BCG Bladder Instillation  BCG # 3 of 6  Due to Bladder Cancer patient is present today for a BCG treatment. Patient was cleaned and prepped in a sterile fashion with betadine. A 14FR catheter was inserted, urine return was noted , urine was yellow in color.  50ml of reconstituted BCG was instilled into the bladder. The catheter was then removed. Patient tolerated well, no complications were noted  Performed by: Jung Ingerson LPN  Follow up/ Additional notes: Keep scheduled NV

## 2023-06-09 ENCOUNTER — Ambulatory Visit: Payer: 59

## 2023-06-09 DIAGNOSIS — C674 Malignant neoplasm of posterior wall of bladder: Secondary | ICD-10-CM | POA: Diagnosis not present

## 2023-06-09 LAB — URINALYSIS, ROUTINE W REFLEX MICROSCOPIC
Bilirubin, UA: NEGATIVE
Glucose, UA: NEGATIVE
Ketones, UA: NEGATIVE
Leukocytes,UA: NEGATIVE
Nitrite, UA: NEGATIVE
Protein,UA: NEGATIVE
RBC, UA: NEGATIVE
Specific Gravity, UA: 1.01 (ref 1.005–1.030)
Urobilinogen, Ur: 0.2 mg/dL (ref 0.2–1.0)
pH, UA: 7 (ref 5.0–7.5)

## 2023-06-09 MED ORDER — BCG LIVE 50 MG IS SUSR
3.2400 mL | Freq: Once | INTRAVESICAL | Status: AC
  Administered 2023-06-09: 81 mg via INTRAVESICAL

## 2023-06-09 NOTE — Progress Notes (Addendum)
 BCG Bladder Instillation  BCG # 4 OF 6  Due to Bladder Cancer patient is present today for a BCG treatment. Patient was cleaned and prepped in a sterile fashion with betadine. A 14FR catheter was inserted, urine return was noted 25ml, urine was YELLOW in color.  50ml of reconstituted BCG was instilled into the bladder. The catheter was then removed. Patient tolerated well, no complications were noted  Performed by: Jaonna Word LPN  Follow up/ Additional notes: keep scheduled NV

## 2023-06-09 NOTE — Addendum Note (Signed)
 Addended by: Roselee Cong on: 06/09/2023 10:48 AM   Modules accepted: Level of Service

## 2023-06-16 ENCOUNTER — Ambulatory Visit (INDEPENDENT_AMBULATORY_CARE_PROVIDER_SITE_OTHER): Payer: 59

## 2023-06-16 DIAGNOSIS — C679 Malignant neoplasm of bladder, unspecified: Secondary | ICD-10-CM

## 2023-06-16 LAB — URINALYSIS, ROUTINE W REFLEX MICROSCOPIC
Bilirubin, UA: NEGATIVE
Glucose, UA: NEGATIVE
Ketones, UA: NEGATIVE
Leukocytes,UA: NEGATIVE
Nitrite, UA: NEGATIVE
Protein,UA: NEGATIVE
Specific Gravity, UA: 1.01 (ref 1.005–1.030)
Urobilinogen, Ur: 0.2 mg/dL (ref 0.2–1.0)
pH, UA: 7 (ref 5.0–7.5)

## 2023-06-16 MED ORDER — BCG LIVE 50 MG IS SUSR
3.2400 mL | Freq: Once | INTRAVESICAL | Status: AC
  Administered 2023-06-16: 81 mg via INTRAVESICAL

## 2023-06-16 NOTE — Patient Instructions (Signed)
Patient Education: (BCG) Into the Bladder (Intravesical Immunotherapy)  BCG is a vaccine which is used to prevent tuberculosis (TB).  But it's also a helpful treatment for some early bladder cancers.  When BCG goes directly into the bladder the treatment is described as intravesical.  BCG is a type of immunotherapy.  Immunotherapy stimulates the body's immune system to destroy cancer cells.  How it's given BCG treatment is given to you in an outpatient setting.  It takes a few minutes to administer and you can go home as soon as it's finished.  It might be a good idea to ask someone to bring you, particularly the fist time.  Unlike chemotherapy into the bladder, BCG treatment is never given immediately after surgery to remove bladder tumors.  There needs to be a delay usually of at least two weeks after surgery, before you can have it.  You won't be given treatment with BCG if you are unwell or have an infection in your urine.  You're usually asked to limit the amount you drink before your treatment.  This will help to increase the concentration of BCG in your bladder.  Drinking too much before your treatment may make your bladder feel uncomfortably full.  If you normally take water tablets (diuretics) take them later in the day after your treatment.  Your nurse or doctor will give you more advise about preparing for your treatment.  You will have a small tube (catheter) placed into your bladder.  Your doctor will then put the liquid vaccine directly into your bladder through the catheter and remove the catheter.  You will need to hold your urine for two hours afterwards.  Rotating every 15 minutes from side to side. This can be difficult but it's to give the treatment time to work.  When the treatment is over you can go to the toilet.  After your treatment there are some precautions you'll need to take.  This is because BCG is a live vaccine and other people shouldn't be exposed to it.  For the  next six hours, you'll need to avoid your urine splashing on the toilet seat and getting any urine on your hands.  It might be easer for men to sit down when they're using an ordinary toilet although using a stand up urinal should be alright.  The main this is to avoid splashing urine and spreading the vaccine.  You will also be asked to put 1/2 cup undiluted bleach into the toilet to destroy any live vaccine and leave it for 15 minutes until you flush for the next 6 voids.  Side Effects Because BCG goes directly into the bladder most of the side effects are linked with the bladder.  They usually go away within one to two days after your treatment.  The most common ones are: -needing to pass urine often -pain when you pass urine -blood in urine -flu-like symptoms (tiredness, general aching and raised temperature)  Theses side effects should settle down within a day or two.  If they don't get better contact your doctor.  Drinking lots of fluids can help flush the drug out of your bladder and reduce some of these effects.  Taking Ibuprofen or Aleve is encouraged unless you have a condition that would make these medications unsafe to take (renal failure, diabetes, gerd)  Rare side effects can include a continuing high temperature (fever), pain in your joints and a cough.  If you have any of these symptoms, or if you  feel generally unwell, contact your doctor.  These symptoms could be a sign of a more serious infection (due to BCG) that needs to be treated immediately.  If this happens you'll be treated with the same drugs (antibiotics) that are used to treat TB.  Contraception Men should use a condom during sex for the first 48 hours after their treatment.  If you are a women who has had BCG treatment then your partner should use a condom.  Using a condom will protect your partner from any vaccine present in your semen or vaginal fluid.  We don't know how BCG may affect a developing fetus so it's not  advisable to become pregnant or father a child while having it.  It is important to use effective contraception during your treatment and for six weeks afterwards.  You can discuss this with your doctor or specialist nurse.

## 2023-06-16 NOTE — Progress Notes (Signed)
BCG Bladder Instillation  BCG # 5 of 6  Due to Bladder Cancer, patient is present today for BCG treatment. The patient was cleaned and prepped in a sterile fashion with Betadinex3 A 14FR catheter was inserted, urine return was noted 30ml, urine was Clear yellowin color.  50ml of reconstituted BCG was then instilled into the bladder. The catheter was then removed. Patient tolerated well, no complications were noted The patient given and discussed with BCG education attached to the AVS.   Performed by: Cuyler Vandyken LPN

## 2023-06-21 ENCOUNTER — Ambulatory Visit (INDEPENDENT_AMBULATORY_CARE_PROVIDER_SITE_OTHER): Payer: 59

## 2023-06-21 DIAGNOSIS — C679 Malignant neoplasm of bladder, unspecified: Secondary | ICD-10-CM

## 2023-06-21 DIAGNOSIS — N329 Bladder disorder, unspecified: Secondary | ICD-10-CM

## 2023-06-21 DIAGNOSIS — C674 Malignant neoplasm of posterior wall of bladder: Secondary | ICD-10-CM

## 2023-06-21 LAB — URINALYSIS, ROUTINE W REFLEX MICROSCOPIC
Bilirubin, UA: NEGATIVE
Glucose, UA: NEGATIVE
Ketones, UA: NEGATIVE
Leukocytes,UA: NEGATIVE
Nitrite, UA: NEGATIVE
Protein,UA: NEGATIVE
RBC, UA: NEGATIVE
Specific Gravity, UA: 1.01 (ref 1.005–1.030)
Urobilinogen, Ur: 1 mg/dL (ref 0.2–1.0)
pH, UA: 7 (ref 5.0–7.5)

## 2023-06-21 MED ORDER — BCG LIVE 50 MG IS SUSR
3.2400 mL | Freq: Once | INTRAVESICAL | Status: AC
  Administered 2023-06-21: 81 mg via INTRAVESICAL

## 2023-06-21 NOTE — Progress Notes (Signed)
 BCG Bladder Instillation  BCG # 6 of 6  Due to Bladder Cancer, patient is present today for BCG treatment. The patient was cleaned and prepped in a sterile fashion with Betadinex3 A 14FR catheter was inserted, urine return was noted 20ml, urine was Clear yellowin color.  50ml of reconstituted BCG was then instilled into the bladder. The catheter was then removed. Patient tolerated well, no complications were noted The patient given and discussed with BCG education attached to the AVS.   Performed by: Kennyth Lose, CMA  Follow up- 2 month with Dr. Ronne Binning  Additional comments- N/A

## 2023-06-23 ENCOUNTER — Ambulatory Visit: Payer: 59

## 2023-06-30 ENCOUNTER — Ambulatory Visit: Payer: 59

## 2023-07-19 ENCOUNTER — Telehealth: Payer: Self-pay

## 2023-07-19 NOTE — Telephone Encounter (Signed)
 Mr. Abdallah called into the office today to discuss his recent BCG appointments. Patient received a statement for [facility] for 2 charges but reports he only received one medication at his visit. After reviewing the claim with the patient, patient had a medication charge and a bladder instillation charge. Patient voiced understanding of the two charges.  Patient also disputed that he received BCG on 06/21/2023. Patient reports he was scheduled for 06/23/2023 but due to weather we called the patient per patient on the am of 06/22/2023 to come in  on 02/19/20205 to complete his last BCG treatment.  I reviewed patient appointment history with patient and informed him the staff called him on 06/21/2023 and he came 06/21/2023 and last BCG was completed. Our office on 06/22/2023 closed early and no NV schedule open due to inclement weather. Patient again stated, " that is wrong". I again reviewed the time stamp of the appointments and when the procedure was also time stamped with patient. Patient still disagreed and wishes to call [facility] to dispute.

## 2023-08-29 ENCOUNTER — Ambulatory Visit: Payer: 59 | Admitting: Urology

## 2023-08-29 ENCOUNTER — Encounter: Payer: Self-pay | Admitting: Urology

## 2023-08-29 VITALS — BP 146/85 | HR 111

## 2023-08-29 DIAGNOSIS — Z8551 Personal history of malignant neoplasm of bladder: Secondary | ICD-10-CM

## 2023-08-29 DIAGNOSIS — C679 Malignant neoplasm of bladder, unspecified: Secondary | ICD-10-CM

## 2023-08-29 LAB — URINALYSIS, ROUTINE W REFLEX MICROSCOPIC
Bilirubin, UA: NEGATIVE
Glucose, UA: NEGATIVE
Ketones, UA: NEGATIVE
Leukocytes,UA: NEGATIVE
Nitrite, UA: NEGATIVE
Protein,UA: NEGATIVE
RBC, UA: NEGATIVE
Specific Gravity, UA: <1.005 — ABNORMAL LOW (ref 1.005–1.030)
Urobilinogen, Ur: 0.2 mg/dL (ref 0.2–1.0)
pH, UA: 6 (ref 5.0–7.5)

## 2023-08-29 MED ORDER — CIPROFLOXACIN HCL 500 MG PO TABS
500.0000 mg | ORAL_TABLET | Freq: Once | ORAL | Status: AC
  Administered 2023-08-29: 500 mg via ORAL

## 2023-08-29 NOTE — Progress Notes (Signed)
   08/29/23  CC: followup bladder cancer   HPI: Rodney Stone is a 72yo here for followup for bladder cancer. He completed 6 weeks of BCG Blood pressure (!) 146/85, pulse (!) 111. NED. A&Ox3.   No respiratory distress   Abd soft, NT, ND Normal phallus with bilateral descended testicles  Cystoscopy Procedure Note  Patient identification was confirmed, informed consent was obtained, and patient was prepped using Betadine solution.  Lidocaine  jelly was administered per urethral meatus.     Pre-Procedure: - Inspection reveals a normal caliber ureteral meatus.  Procedure: The flexible cystoscope was introduced without difficulty - No urethral strictures/lesions are present. - Enlarged prostate  - Normal bladder neck - Bilateral ureteral orifices identified - Bladder mucosa  reveals no ulcers, tumors, or lesions. Previous resection scare right posterior wall - No bladder stones - No trabeculation   Post-Procedure: - Patient tolerated the procedure well  Assessment/ Plan: Followup 3 months for cystoscopy  No follow-ups on file.  Rodney Nailer, MD

## 2023-08-29 NOTE — Patient Instructions (Signed)

## 2023-11-28 ENCOUNTER — Ambulatory Visit: Admitting: Urology

## 2023-11-28 VITALS — BP 131/77 | HR 88

## 2023-11-28 DIAGNOSIS — C679 Malignant neoplasm of bladder, unspecified: Secondary | ICD-10-CM | POA: Diagnosis not present

## 2023-11-28 MED ORDER — CIPROFLOXACIN HCL 500 MG PO TABS
500.0000 mg | ORAL_TABLET | Freq: Once | ORAL | Status: AC
  Administered 2023-11-28: 500 mg via ORAL

## 2023-11-28 NOTE — Progress Notes (Unsigned)
   11/28/23  CC: followup bladder cancer   HPI: Rodney Stone is a 72yo her efor followup for bladder cancer Blood pressure 131/77, pulse 88. NED. A&Ox3.   No respiratory distress   Abd soft, NT, ND Normal phallus with bilateral descended testicles  Cystoscopy Procedure Note  Patient identification was confirmed, informed consent was obtained, and patient was prepped using Betadine solution.  Lidocaine  jelly was administered per urethral meatus.     Pre-Procedure: - Inspection reveals a normal caliber ureteral meatus.  Procedure: The flexible cystoscope was introduced without difficulty - No urethral strictures/lesions are present. - Enlarged prostate  - Normal bladder neck - Bilateral ureteral orifices identified - Bladder mucosa  reveals no ulcers, tumors, or lesions - No bladder stones - No trabeculation     Post-Procedure: - Patient tolerated the procedure well  Assessment/ Plan: Followup 3 months for cystoscopy  No follow-ups on file.  Belvie Clara, MD

## 2023-11-29 LAB — URINALYSIS, ROUTINE W REFLEX MICROSCOPIC
Bilirubin, UA: NEGATIVE
Glucose, UA: NEGATIVE
Ketones, UA: NEGATIVE
Leukocytes,UA: NEGATIVE
Nitrite, UA: NEGATIVE
Protein,UA: NEGATIVE
RBC, UA: NEGATIVE
Specific Gravity, UA: 1.01 (ref 1.005–1.030)
Urobilinogen, Ur: 0.2 mg/dL (ref 0.2–1.0)
pH, UA: 6 (ref 5.0–7.5)

## 2023-11-30 ENCOUNTER — Other Ambulatory Visit (HOSPITAL_COMMUNITY): Payer: Self-pay | Admitting: Nurse Practitioner

## 2023-11-30 ENCOUNTER — Encounter: Payer: Self-pay | Admitting: Urology

## 2023-11-30 DIAGNOSIS — Z720 Tobacco use: Secondary | ICD-10-CM

## 2023-11-30 NOTE — Patient Instructions (Signed)

## 2023-12-27 ENCOUNTER — Ambulatory Visit (HOSPITAL_COMMUNITY)

## 2023-12-29 ENCOUNTER — Encounter (HOSPITAL_COMMUNITY): Payer: Self-pay

## 2023-12-29 ENCOUNTER — Ambulatory Visit (HOSPITAL_COMMUNITY)

## 2024-01-27 ENCOUNTER — Ambulatory Visit (HOSPITAL_COMMUNITY)
Admission: RE | Admit: 2024-01-27 | Discharge: 2024-01-27 | Disposition: A | Discharge status: Home / Self Care | Source: Ambulatory Visit | Attending: Nurse Practitioner | Admitting: Nurse Practitioner

## 2024-01-27 DIAGNOSIS — F17210 Nicotine dependence, cigarettes, uncomplicated: Secondary | ICD-10-CM | POA: Insufficient documentation

## 2024-01-27 DIAGNOSIS — I2510 Atherosclerotic heart disease of native coronary artery without angina pectoris: Secondary | ICD-10-CM | POA: Diagnosis not present

## 2024-01-27 DIAGNOSIS — I700 Atherosclerosis of aorta: Secondary | ICD-10-CM | POA: Diagnosis not present

## 2024-01-27 DIAGNOSIS — D3501 Benign neoplasm of right adrenal gland: Secondary | ICD-10-CM | POA: Insufficient documentation

## 2024-01-27 DIAGNOSIS — Z720 Tobacco use: Secondary | ICD-10-CM | POA: Diagnosis present

## 2024-01-27 DIAGNOSIS — Z122 Encounter for screening for malignant neoplasm of respiratory organs: Secondary | ICD-10-CM | POA: Diagnosis not present

## 2024-01-27 DIAGNOSIS — J439 Emphysema, unspecified: Secondary | ICD-10-CM | POA: Insufficient documentation

## 2024-03-07 ENCOUNTER — Other Ambulatory Visit: Admitting: Urology

## 2024-04-04 ENCOUNTER — Ambulatory Visit: Admitting: Urology

## 2024-04-04 VITALS — BP 138/69 | HR 85

## 2024-04-04 DIAGNOSIS — C679 Malignant neoplasm of bladder, unspecified: Secondary | ICD-10-CM

## 2024-04-04 LAB — URINALYSIS, ROUTINE W REFLEX MICROSCOPIC
Bilirubin, UA: NEGATIVE
Glucose, UA: NEGATIVE
Ketones, UA: NEGATIVE
Leukocytes,UA: NEGATIVE
Nitrite, UA: NEGATIVE
Protein,UA: NEGATIVE
RBC, UA: NEGATIVE
Specific Gravity, UA: <1.005 — ABNORMAL LOW (ref 1.005–1.030)
Urobilinogen, Ur: 0.2 mg/dL (ref 0.2–1.0)
pH, UA: 7 (ref 5.0–7.5)

## 2024-04-04 MED ORDER — CIPROFLOXACIN HCL 500 MG PO TABS
500.0000 mg | ORAL_TABLET | Freq: Once | ORAL | Status: AC
  Administered 2024-04-04: 500 mg via ORAL

## 2024-04-04 NOTE — Progress Notes (Signed)
   04/04/24  CC: followup bladder cancer   HPI: Rodney Stone is a 73yo here for followup for bladder cancer Blood pressure 138/69, pulse 85. NED. A&Ox3.   No respiratory distress   Abd soft, NT, ND Normal phallus with bilateral descended testicles  Cystoscopy Procedure Note  Patient identification was confirmed, informed consent was obtained, and patient was prepped using Betadine solution.  Lidocaine  jelly was administered per urethral meatus.     Pre-Procedure: - Inspection reveals a normal caliber ureteral meatus.  Procedure: The flexible cystoscope was introduced without difficulty - No urethral strictures/lesions are present. - Enlarged prostate  - Normal bladder neck - Bilateral ureteral orifices identified - Bladder mucosa  reveals no ulcers, tumors, or lesions - No bladder stones - No trabeculation   Post-Procedure: - Patient tolerated the procedure well  Assessment/ Plan: Followup 3 months for cystoscopy  No follow-ups on file.  Belvie Clara, MD

## 2024-04-10 ENCOUNTER — Encounter: Payer: Self-pay | Admitting: Urology

## 2024-04-10 NOTE — Patient Instructions (Signed)

## 2024-07-18 ENCOUNTER — Other Ambulatory Visit: Admitting: Urology
